# Patient Record
Sex: Male | Born: 1995 | Race: White | Hispanic: No | Marital: Single | Smoking: Never smoker
Health system: Western US, Academic
[De-identification: ages and names within clinical notes are randomized; demographics above are authoritative.]

## PROBLEM LIST (undated history)

## (undated) DIAGNOSIS — Z289 Immunization not carried out for unspecified reason: Secondary | ICD-10-CM

## (undated) HISTORY — DX: Immunization not carried out for unspecified reason: Z28.9

## (undated) HISTORY — PX: JOINT REPLACEMENT: SHX530

---
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
## 2013-10-15 ENCOUNTER — Emergency Department: Payer: Self-pay | Admitting: Emergency Medicine

## 2017-07-27 ENCOUNTER — Emergency Department: Payer: Self-pay

## 2017-07-27 ENCOUNTER — Emergency Department
Admission: EM | Admit: 2017-07-27 | Discharge: 2017-07-27 | Disposition: A | Discharge status: Home / Self Care | Payer: No Typology Code available for payment source | Attending: Emergency Medicine | Admitting: Emergency Medicine

## 2017-07-27 ENCOUNTER — Encounter: Payer: Self-pay | Admitting: Medical Oncology

## 2017-07-27 ENCOUNTER — Emergency Department
Admission: EM | Admit: 2017-07-27 | Discharge: 2017-07-27 | Discharge status: Against Medical Advice | Payer: Self-pay | Attending: Emergency Medicine | Admitting: Emergency Medicine

## 2017-07-27 ENCOUNTER — Emergency Department: Payer: No Typology Code available for payment source

## 2017-07-27 DIAGNOSIS — Y92410 Unspecified street and highway as the place of occurrence of the external cause: Secondary | ICD-10-CM | POA: Diagnosis not present

## 2017-07-27 DIAGNOSIS — Y999 Unspecified external cause status: Secondary | ICD-10-CM | POA: Diagnosis not present

## 2017-07-27 DIAGNOSIS — S99911A Unspecified injury of right ankle, initial encounter: Secondary | ICD-10-CM | POA: Diagnosis present

## 2017-07-27 DIAGNOSIS — Y929 Unspecified place or not applicable: Secondary | ICD-10-CM | POA: Insufficient documentation

## 2017-07-27 DIAGNOSIS — F17210 Nicotine dependence, cigarettes, uncomplicated: Secondary | ICD-10-CM | POA: Diagnosis not present

## 2017-07-27 DIAGNOSIS — Y939 Activity, unspecified: Secondary | ICD-10-CM | POA: Insufficient documentation

## 2017-07-27 DIAGNOSIS — X500XXA Overexertion from strenuous movement or load, initial encounter: Secondary | ICD-10-CM | POA: Insufficient documentation

## 2017-07-27 DIAGNOSIS — S92001A Unspecified fracture of right calcaneus, initial encounter for closed fracture: Secondary | ICD-10-CM | POA: Insufficient documentation

## 2017-07-27 DIAGNOSIS — Y9389 Activity, other specified: Secondary | ICD-10-CM | POA: Insufficient documentation

## 2017-07-27 DIAGNOSIS — W1789XA Other fall from one level to another, initial encounter: Secondary | ICD-10-CM | POA: Insufficient documentation

## 2017-07-27 MED ORDER — MELOXICAM 7.5 MG PO TABS: 7.5000 mg | ORAL_TABLET | Freq: Every day | ORAL | 1 refills | Status: AC

## 2017-07-27 NOTE — ED Triage Notes (Signed)
Pt reports jumping from about 5 ft and landing on his rt heel the wrong way, pt c/o pain to area.
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
## 2017-07-27 NOTE — ED Notes (Signed)
This RN notified by Elita Quick, EDT that patient was visualized leaving the ER with his friend. Pt was not D/C at this time.

## 2017-07-27 NOTE — ED Notes (Signed)
Patient's acuity changed to a 4 per MD Rifenbark.
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
## 2017-07-27 NOTE — ED Notes (Signed)
AAOx3.  Skin warm and dry.  NAD 

## 2017-07-27 NOTE — ED Notes (Signed)
Spoke with Dr Lamont Snowball regarding pt's return to ED to complete treatment; order for foot xray only;

## 2017-07-27 NOTE — ED Notes (Signed)
Pt seen here several hours ago for foot injury; left before treatment complete, says he had to leave to get his girlfriend at work; pt has returned to finish treatment, which he believes is just a splint to right foot;
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
## 2018-02-10 IMAGING — CT CT FOOT*R* W/O CM
3 series · 15 of 27 positions shown, 18 images · non-contrast
Comparison: Plain films earlier today.

CLINICAL DATA: Calcaneal fracture after jumping from roof.

EXAM:
CT OF THE RIGHT FOOT WITHOUT CONTRAST
TECHNIQUE: Multidetector CT imaging of the right foot was performed according
to the standard protocol. Multiplanar CT image reconstructions were
also generated.

[Series 602: long axia st · axial · 0.57mm/px · z∈[-369,-288]mm · 5 of 91 slices shown]
[im 16/91  bone]
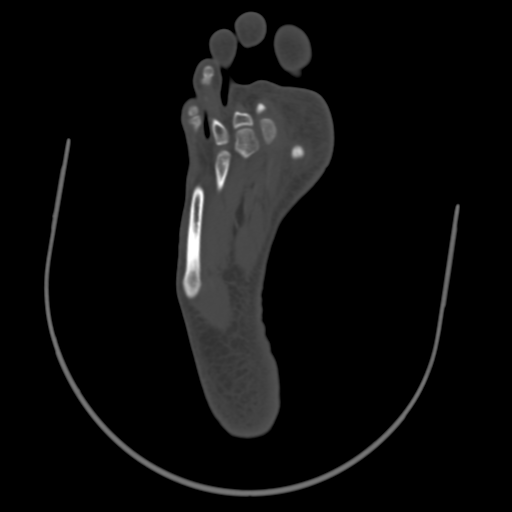
[im 31/91  bone]
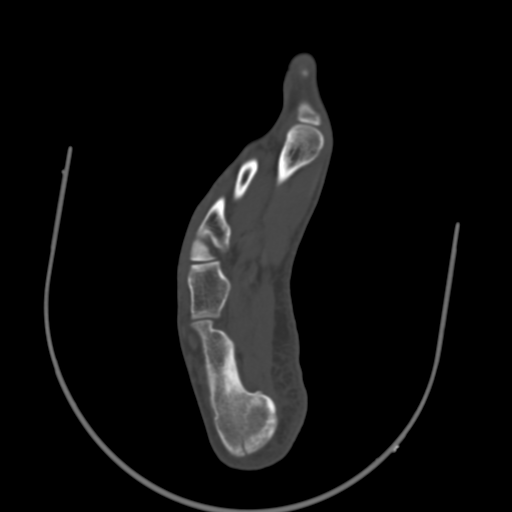
[im 46/91  bone]
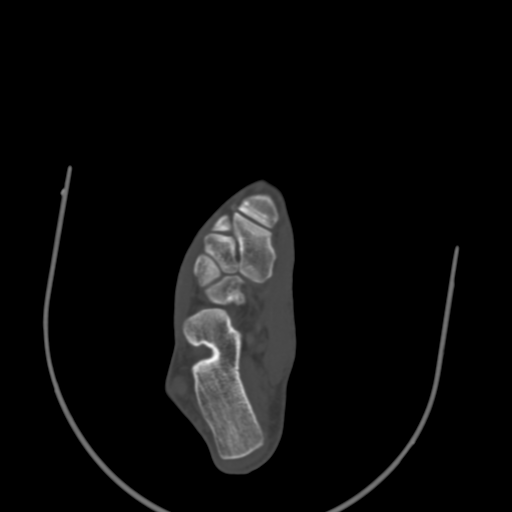
[im 61/91  bone]
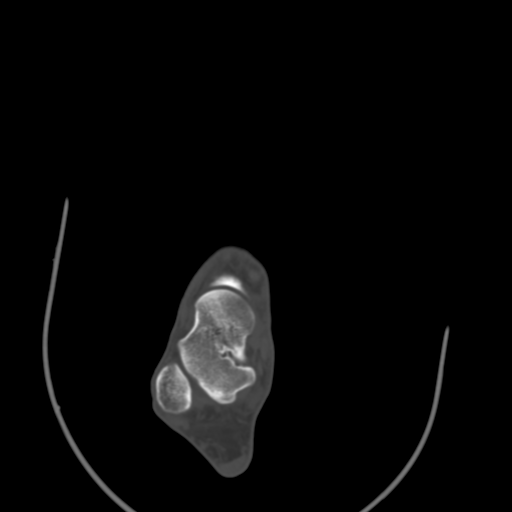
[im 76/91  bone]
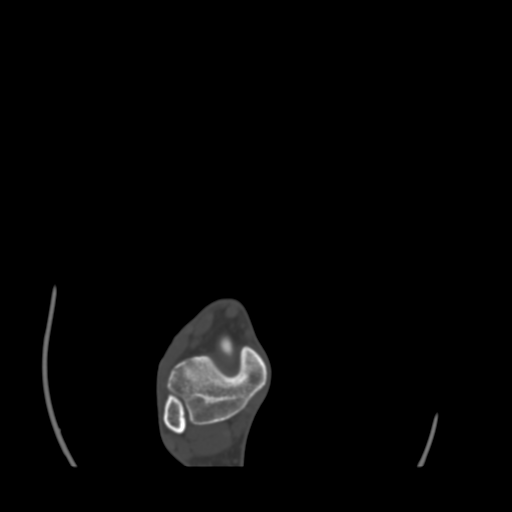

[Series 604: sag st · sagittal · 0.57mm/px · 5 of 61 slices shown, 6 images]
[im 21/61  bone]
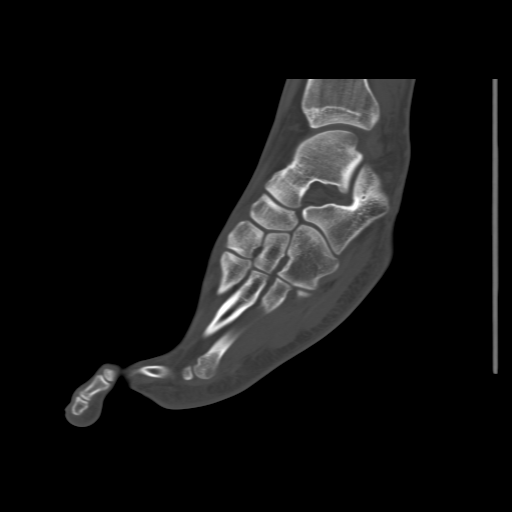
[im 26/61  bone]
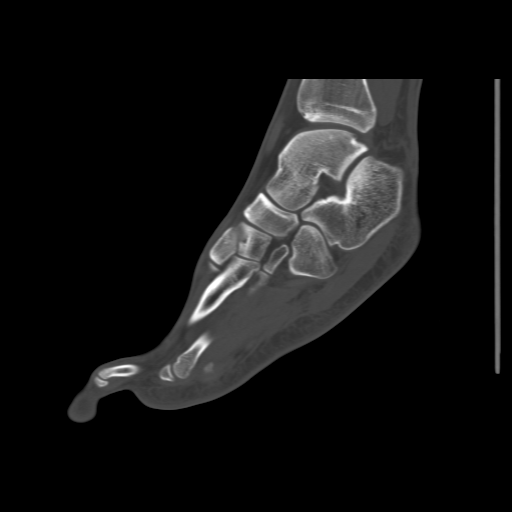
[im 31/61  soft-tissue]
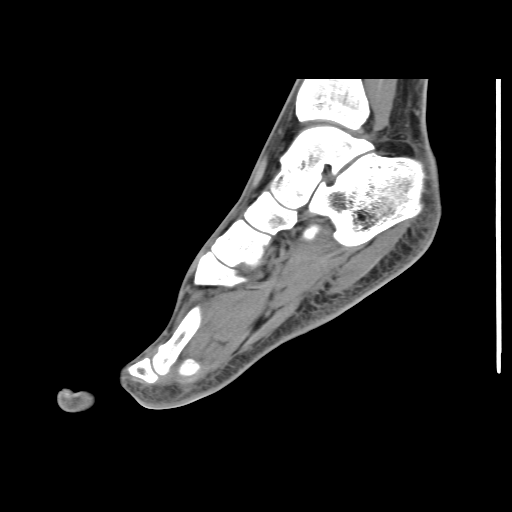
[im 31/61  bone]
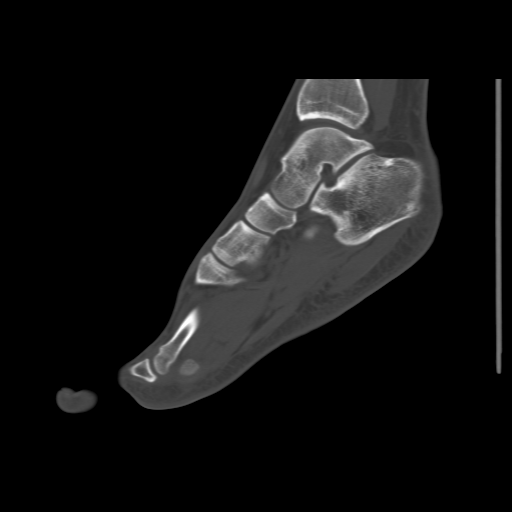
[im 36/61  bone]
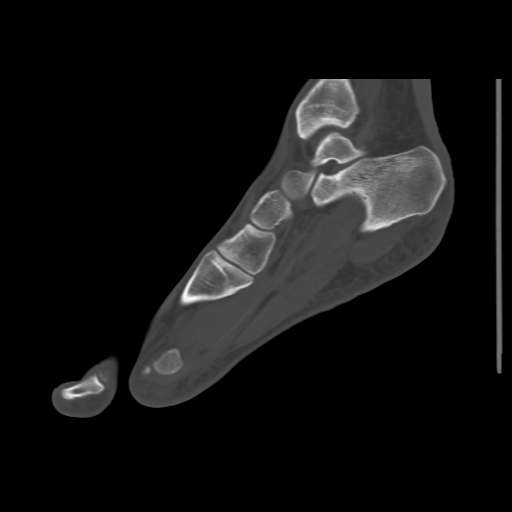
[im 41/61  bone]
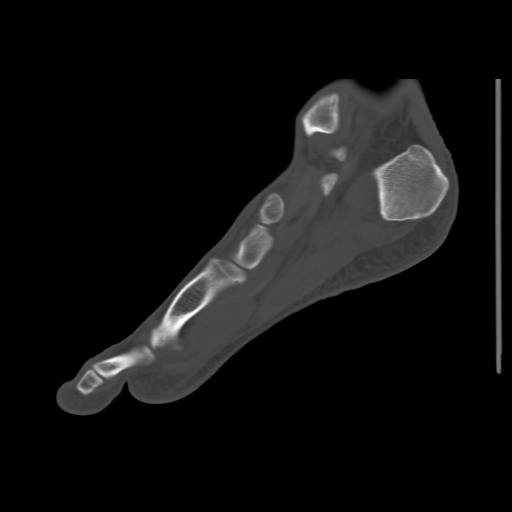

[Series 605: long axis bone · axial · 0.57mm/px · z∈[-381,-311]mm · 5 of 85 slices shown, 7 images]
[im 15/85  soft-tissue]
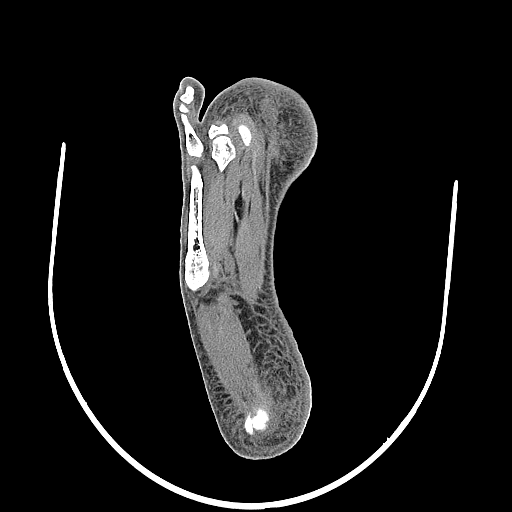
[im 15/85  bone]
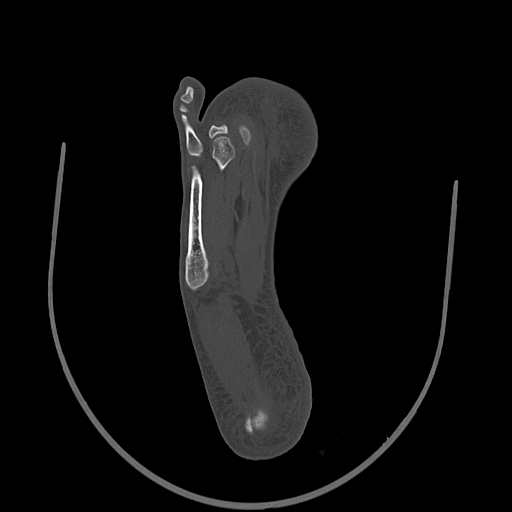
[im 29/85  bone]
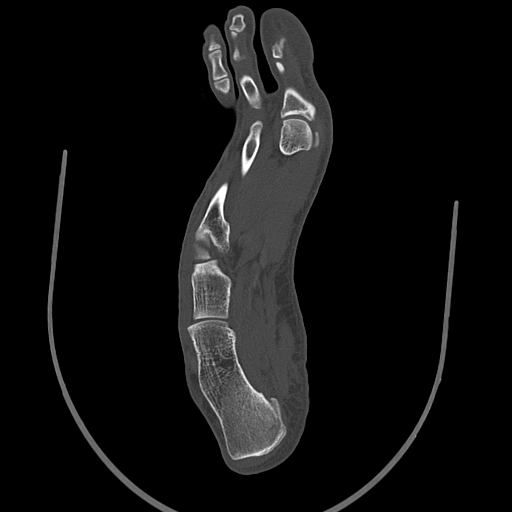
[im 43/85  bone]
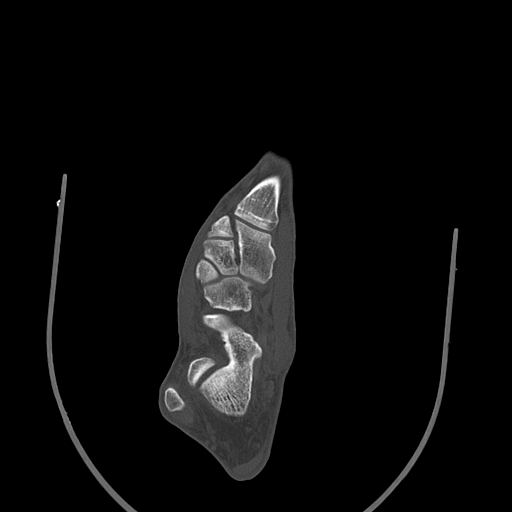
[im 57/85  bone]
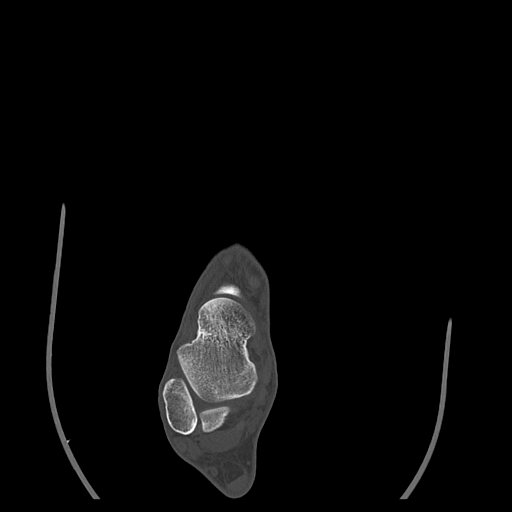
[im 71/85  soft-tissue]
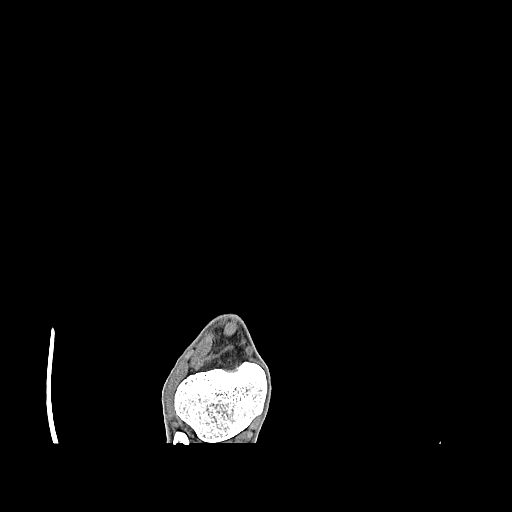
[im 71/85  bone]
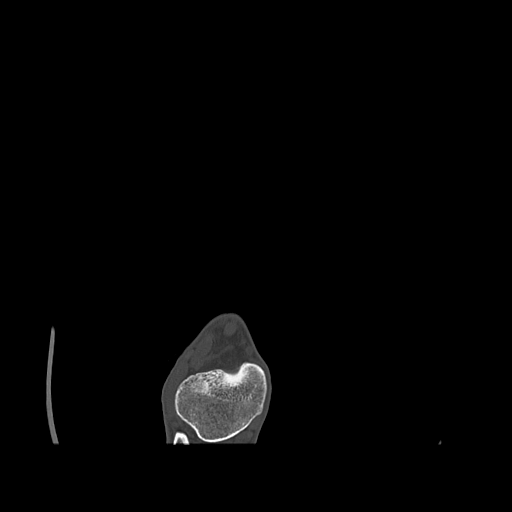

[15 of 27 positions shown; findings below may reference images not displayed]

FINDINGS: Bones/Joint/Cartilage

There is are fracture of the posterior calcaneus involving the
plantar medial aspect with long axis oblique to the long axis of the
calcaneus. There is very minimal displacement of this fracture and
no loss of height of the calcaneus. No other fractures identified.
Ankle mortise is normal.

Ligaments

Suboptimally assessed by CT.

Muscles and Tendons

Within normal.

Soft tissues

Mild subcutaneous edema over the heel.
IMPRESSION: Known oblique fracture of the calcaneus involving the posteromedial/
plantar aspect without significant displacement or loss of height of
the calcaneus.

## 2021-04-05 DIAGNOSIS — U071 COVID-19: Secondary | ICD-10-CM

## 2021-04-05 HISTORY — DX: COVID-19: U07.1

## 2021-08-21 ENCOUNTER — Encounter: Payer: Self-pay | Admitting: Family Medicine

## 2021-08-21 DIAGNOSIS — S62501A Fracture of unspecified phalanx of right thumb, initial encounter for closed fracture: Secondary | ICD-10-CM

## 2021-08-22 ENCOUNTER — Telehealth: Payer: Self-pay

## 2021-08-22 NOTE — Telephone Encounter (Signed)
[facility] department to use this Smart Phrase when sending a referral review request for an appointment    Reason for referral:  Closed nondisplaced fracture of phalanx of right thumb        Have records been uploaded to media? yes

## 2021-08-24 NOTE — Telephone Encounter (Signed)
Please schedule patient as indicated below.    Please combine departments to find sooner availability. Acute injuries schedule w/n 5 days. Send message to clinic supervisor

## 2021-09-01 ENCOUNTER — Encounter (HOSPITAL_BASED_OUTPATIENT_CLINIC_OR_DEPARTMENT_OTHER): Payer: Self-pay | Admitting: Hand Surgery

## 2021-09-04 ENCOUNTER — Encounter (HOSPITAL_BASED_OUTPATIENT_CLINIC_OR_DEPARTMENT_OTHER): Payer: Self-pay | Admitting: Hand Surgery

## 2021-09-04 ENCOUNTER — Ambulatory Visit: Payer: MEDICAID | Attending: Hand Surgery | Admitting: Hand Surgery

## 2021-09-04 ENCOUNTER — Ambulatory Visit (HOSPITAL_BASED_OUTPATIENT_CLINIC_OR_DEPARTMENT_OTHER): Admit: 2021-09-04 | Discharge: 2021-09-04 | Disposition: A | Discharge status: Home Health | Payer: MEDICAID

## 2021-09-04 DIAGNOSIS — S62021K Displaced fracture of middle third of navicular [scaphoid] bone of right wrist, subsequent encounter for fracture with nonunion: Secondary | ICD-10-CM | POA: Insufficient documentation

## 2021-09-04 DIAGNOSIS — S62021A Displaced fracture of middle third of navicular [scaphoid] bone of right wrist, initial encounter for closed fracture: Secondary | ICD-10-CM

## 2021-09-04 DIAGNOSIS — M79644 Pain in right finger(s): Secondary | ICD-10-CM

## 2021-09-04 DIAGNOSIS — M25531 Pain in right wrist: Secondary | ICD-10-CM | POA: Insufficient documentation

## 2021-09-04 DIAGNOSIS — S62501A Fracture of unspecified phalanx of right thumb, initial encounter for closed fracture: Secondary | ICD-10-CM | POA: Insufficient documentation
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
## 2021-09-05 ENCOUNTER — Telehealth (HOSPITAL_BASED_OUTPATIENT_CLINIC_OR_DEPARTMENT_OTHER): Payer: Self-pay | Admitting: Hand Surgery

## 2021-09-05 NOTE — Telephone Encounter (Signed)
4:00 pm 09/04/21 - Patient called & left message to schedule surgery with  Dr. Sabino Snipes. Phone [phone].

## 2021-09-05 NOTE — Telephone Encounter (Signed)
I spoke to patient & scheduled surgery. Patient voiced understanding. See below.    Consents: 09/04/2021 In media - DR. MEUNIER TO SIGN SURGERY CONSENT ON DAY OF SURGERY.    [facility] Phone Appointment: 09/12/2021 at 10:00 am [facility])    H&P: Day of surgery  Sx Date/Time: 09/21/2021 at 11:15 am, check in at 9:15 am ([facility])  P/O:  10/04/2021 at 10:20 am with Dr. Sabino Snipes, arrive 20 min early ([employer])     Surgery Confirmation Letter sent via [place] My Chart on 09/05/2021  Tentative Surgery appointment information relayed to patient. Surgery location confirmed with patient and the following instructions given:      All surgical patients scheduled for [facility] will be called the day before their scheduled surgery to remind them of the elements:     i. Date and Time to be NPO as specified by the patients Surgeon or Anesthesia Provider.   ii. Ensure patient will have transportation home and be accompanied by a responsible adult.   iii. Reminder to leave valuables at home (give examples if needed).   iv. Instructions to bring funds for co-pay of medications if required.  v. Assess for any language issues, document need for translator (if needed) in EPIC, and communicate on the OR schedule.   vi. Remind patient to not wax or shave near the intended surgical site.   vii. Provide the patient with a number to call if they are going to be late or are unable to report for their surgery as scheduled. Patient to arrive 2.0 hours prior to surgery start time. Please check in at admissions.  viii. If patient was not seen in [facility], remind him/her to shower the morning of surgery, remove any piercings and/or jewelry, do not wear makeup, and do wear loose comfortable clothes, bring current medication list.   ix. Call surgeon for any specific questions regarding the procedures related to surgical preparation, or medications.

## 2021-09-12 ENCOUNTER — Ambulatory Visit (INDEPENDENT_AMBULATORY_CARE_PROVIDER_SITE_OTHER): Payer: MEDICAID

## 2021-09-12 ENCOUNTER — Encounter (INDEPENDENT_AMBULATORY_CARE_PROVIDER_SITE_OTHER): Payer: Self-pay

## 2021-09-12 VITALS — Ht 74.0 in | Wt 165.0 lb

## 2021-09-12 DIAGNOSIS — Z01818 Encounter for other preprocedural examination: Secondary | ICD-10-CM

## 2021-09-12 NOTE — Interdisciplinary (Signed)
Anesthesia Preparedness Clinic [facility]) RN PHONE CALL NOTE    Phone call to patient from [facility] RN today.     Confirmed medical history & that medications listed in Epic are accurate and up to date.     Patient scheduled for surgery on 09/21/2021 at [facility]    Confirmed preoperative instructions with patient including NPO instructions.     Planning your surgery information sent to patient via MyChart    Pt had COVID June 2022, not hospitalized, denies lingering symptoms/sequelae---still NOT vaccinated against COVID    No questions or concerns at this time.

## 2021-09-12 NOTE — Patient Instructions (Signed)
Your surgery is currently scheduled at [facility] on 09/21/2021  The scheduler will be contacting you with the check in time                                                                                 FACILITY ADDRESS    [facility], [address] [place], [place] [address]  Check in and Operating room is down the elevator on the lower level (LL)    PARKING    [facility]: [employer], Adult nurse structure, or Scientist, water quality parking (7am-5pm at [employer] entrance and [employer] engineer; 5am-5pm at [employer] by Emergency room/Labor and Delivery) for same cost as self-parking.  [email or web address]       QUESTIONS    If you have any questions between now and the day of your surgery, please do not hesitate to call:     [facility] Anesthesia Prepared[facility]: [phone]   [facility] [phone]      DAY OF SURGERY ARRIVAL TIME:    On the day of your Surgery/Procedure, please arrive at the time provided by the surgeon's clinic.  [facility] surgery Pre-op team will contact you the day before surgery.      MEDICATION INSTRUCTIONS BEFORE SURGERY/PROCEDURE:        PLEASE HOLD ALL NSAIDS (non-steroidal anti-inflammatory drugs) SUCH AS advil, aleve, motrin, ibuprofen, relafen, lodine, feldene, Diclofenac, voltaren, indomethacin, naproxen, celebrex, Mobic 7 days before surgery.       Please hold vitamins, supplements, CO-Q10, Glucosamine, herbs & fish oil 7 days before surgery. Vitamin C, D are ok to take.     It is OK to take acetaminophen (Tylenol) for pain around the time of surgery unless you have liver disease.      AFTER YOUR VISIT WITH [place], IF YOU START TAKING A NEW MEDICATION BEFORE SURGERY, PLEASE CALL us TO MAKE SURE IT IS SAFE TO TAKE & WILL NOT AFFECT YOUR SURGERY.         OSA INSTRUCTIONS:     If you use a CPAP machine, please bring the entire machine, including mask and tubing, with you on the day of  surgery.    TO DO LIST:          EATING/DRINKING     DO NOT EAT OR DRINK ANYTHING AFTER MIDNIGHT ON THE DAY OF SURGERY    Preparing for your Surgery:     Please wear clean loose-fitting clothes and leave valuables at home   Do not shave or remove body hair. Facial shaving is permitted. If you are having head/face surgery, ask your surgeon's office whether you can shave.  Bring a picture ID and your insurance card, and be prepared to pay your deductible or co-insurance by cash, check, or credit card when you arrive.   All patients [facility] go home after their surgery, Please make sure to arrange for an adult to drive you home. You CANNOT use UBER or LYFT. If you do not have a ride, your surgery may be cancelled.       On The Day of Your Surgery:      Check in at the location mentioned above  COVID testing may be done on arrival to the Pre-op or Procedural areas according to the current [facility] mandate.   If you are a woman of child bearing age, please note that you may be asked to give a urine sample upon check-in  You will meet your anesthesia and surgery teams in the preoperative holding area before surgery.   Once surgery is over, you will wake up in the recovery room.  An adult chaperone will need to stay with you for the first 24 hours after surgery.   Visitor policy during the JIRCV-89 pandemic is subject to change. Current visitor policy can be found at [email or web address]      A video about what to expect for the day of surgery can be found here:    [email or web address]  Or by searching You-tube for [place] before surgery and [place] after surgery     Your medical records are available to you at [email or web address] click sign up now.

## 2021-09-20 ENCOUNTER — Other Ambulatory Visit: Payer: Self-pay

## 2021-09-20 ENCOUNTER — Encounter (HOSPITAL_BASED_OUTPATIENT_CLINIC_OR_DEPARTMENT_OTHER): Payer: Self-pay | Admitting: Hand Surgery

## 2021-09-20 ENCOUNTER — Other Ambulatory Visit (HOSPITAL_BASED_OUTPATIENT_CLINIC_OR_DEPARTMENT_OTHER): Payer: Self-pay | Admitting: Hand Surgery

## 2021-09-20 DIAGNOSIS — Z419 Encounter for procedure for purposes other than remedying health state, unspecified: Secondary | ICD-10-CM

## 2021-09-20 NOTE — Anesthesia Preprocedure Evaluation (Addendum)
ANESTHESIA PRE-OPERATIVE EVALUATION    Patient Information    Name: Randy Erickson    MRN: [id number]    DOB: 1996-07-05    Age: 25 year old    Sex: male  Procedure(s):  Right repair fracture, nonunion, scaphoid, possible bone graft      Pre-op Vitals:   There were no vitals taken for this visit.        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     General:  negative for General ROS   Cardiovascular:     Anesthesia History:  negative anesthesia history ROS   Pulmonary:      Neuro/Psych:   negative neuro/psych ROS   Hematology/Oncology:   hematologic/lymphatic negative      GI/Hepatic:   Infectious Disease:  negative for infectious disease     Renal:   Endocrine/Other:     Pregnancy History:   Pediatrics:         Pre Anesthesia Testing (PCC/CPC) notes/comments:                 Physical Exam    Airway:    Inter-inciser distance > 4 cm  Prognanth Able    Mallampati: I  Neck ROM: full  TM distance: 5-6 cm  Short thick neck: No          Cardiovascular:  - cardiovascular exam normal   Rhythm: regular   Rate: normal         Pulmonary:  - pulmonary exam normal      breath sounds clear to auscultation        Neuro/Neck/Skeletal/Skin:  - Neck/Neuro/Skeletal/Skin exam normal          Dental:  - normal exam    Abdominal:   - normal exam     General: normal weight     Additional Clinical Notes:               Last  OSA (STOP BANG) Score:  No data recorded    Last OSA  (STOP) Score for   Has a physician diagnosed you with sleep apnea?: No  Do you use a CPAP at home?: No  Do you snore loudly (loud enough to be heard through a closed door)?: 0  Do you often feel tired, fatigued or sleepy during the day?: 0  Has anyone observed that you stop breathing while you are sleeping?: 0  Have you ever been treated for high blood pressure?: 0  OSA total score (A score of 2 or more is high risk. Offer patient sleep  study.): 0                   Past Medical History:   Diagnosis Date    COVID-19 vaccination not done     COVID-19 virus infection 04/2021    not hospitalized; denies lingering symptoms/sequelae     Past Surgical History:   Procedure Laterality Date    hand Right 2010     Social History     Socioeconomic History    Marital status: Single   Tobacco Use    Smoking status: Never    Smokeless tobacco: Never   Vaping Use    Vaping Use: Every day    Substances: Nicotine   Substance and Sexual Activity    Alcohol use: Not Currently     Alcohol  Use: Not on file       No current facility-administered medications for this encounter.     No current outpatient medications on file.     No Known Allergies    Labs and Other Data  No results found for: NA, SODIUM, K, CL, BICARB, BUN, CREAT, GLU, [place]  No results found for: AST, ALT, GGT, LDH, ALK, TP, ALB, TBILI, DBILI  No results found for: WBC, RBC, HGB, HCT, MCV, MCHC, RDW, PLT, PLCTEL, MPV, MPVH, SEG, LYMPHS, MONOS, EOS, BASOS  No results found for: INR, PTT  No results found for: ARTPH, ARTPO2, ARTPCO2    Anesthesia Plan:  Risks and Benefits of Anesthesia  I have personally performed an appropriate pre-anesthesia physical exam of the patient (including heart, lungs, and airway) prior to the anesthetic and reviewed the pertinent medical history, drug and allergy history, laboratory and imaging studies and consultations.   I have determined that the patient has had adequate assessment and testing.  I have validated the documentation of these elements of the patient exam and/or have made necessary changes to reflect my own observations during my pre-anesthesia exam.  Anesthetic techniques, invasive monitors, anesthetic drugs for induction, maintenance and post-operative analgesia, risks and alternatives have been explained to the patient and/or patient's representatives.    I have prescribed the anesthetic plan:         Planned anesthesia method: General         ASA 1  (Healthy)     Potential anesthesia problems identified and risks including but not limited to the following were discussed with patient and/or patient's representative: Adverse or allergic drug reaction, Administration of blood products, Recall, Ocular injury, Dental injury or sore throat, Nerve injury, Death, Injury to brain, heart and other organs and Patient declined further  discussion of risks of anesthesia    No Beta Blocker Indicated: Patient not on beta blockers    Planned monitoring method: Routine monitoring    Informed Consent:  Anesthetic plan and risks discussed with Patient.    Plan discussed with OR Nurse and Surgeon.

## 2021-09-21 ENCOUNTER — Encounter (HOSPITAL_BASED_OUTPATIENT_CLINIC_OR_DEPARTMENT_OTHER): Payer: Self-pay | Admitting: Hand Surgery

## 2021-09-21 ENCOUNTER — Ambulatory Visit (HOSPITAL_BASED_OUTPATIENT_CLINIC_OR_DEPARTMENT_OTHER): Payer: MEDICAID | Admitting: Student in an Organized Health Care Education/Training Program

## 2021-09-21 ENCOUNTER — Ambulatory Visit
Admission: RE | Admit: 2021-09-21 | Discharge: 2021-09-21 | Disposition: A | Discharge status: Home / Self Care | Payer: MEDICAID | Attending: Hand Surgery | Admitting: Hand Surgery

## 2021-09-21 ENCOUNTER — Ambulatory Visit (HOSPITAL_BASED_OUTPATIENT_CLINIC_OR_DEPARTMENT_OTHER): Payer: MEDICAID

## 2021-09-21 ENCOUNTER — Encounter (HOSPITAL_BASED_OUTPATIENT_CLINIC_OR_DEPARTMENT_OTHER): Payer: Self-pay | Admitting: Student in an Organized Health Care Education/Training Program

## 2021-09-21 ENCOUNTER — Encounter (HOSPITAL_BASED_OUTPATIENT_CLINIC_OR_DEPARTMENT_OTHER): Admission: RE | Disposition: A | Discharge status: Home Health | Payer: Self-pay | Attending: Hand Surgery

## 2021-09-21 ENCOUNTER — Other Ambulatory Visit (HOSPITAL_COMMUNITY): Payer: Self-pay

## 2021-09-21 DIAGNOSIS — W19XXXS Unspecified fall, sequela: Secondary | ICD-10-CM

## 2021-09-21 DIAGNOSIS — S62021K Displaced fracture of middle third of navicular [scaphoid] bone of right wrist, subsequent encounter for fracture with nonunion: Secondary | ICD-10-CM | POA: Insufficient documentation

## 2021-09-21 DIAGNOSIS — S62002K Unspecified fracture of navicular [scaphoid] bone of left wrist, subsequent encounter for fracture with nonunion: Secondary | ICD-10-CM | POA: Insufficient documentation

## 2021-09-21 DIAGNOSIS — Z419 Encounter for procedure for purposes other than remedying health state, unspecified: Secondary | ICD-10-CM

## 2021-09-21 DIAGNOSIS — S62001K Unspecified fracture of navicular [scaphoid] bone of right wrist, subsequent encounter for fracture with nonunion: Secondary | ICD-10-CM

## 2021-09-21 SURGERY — REPAIR, FRACTURE NONUNION, SCAPHOID BONE
Anesthesia: General | Site: Wrist | Laterality: Right | Wound class: Class I (Clean)

## 2021-09-21 MED ORDER — MIDAZOLAM HCL 2 MG/2ML IJ SOLN
INTRAMUSCULAR | Status: AC
Start: 2021-09-21 — End: ?
  Filled 2021-09-21: qty 2

## 2021-09-21 MED ORDER — ONDANSETRON HCL 4 MG/2ML IV SOLN
INTRAMUSCULAR | Status: DC | PRN
Start: 2021-09-21 — End: 2021-09-21
  Administered 2021-09-21 (×2): 4 mg via INTRAVENOUS

## 2021-09-21 MED ORDER — BACITRACIN ZINC 500 UNIT/GM EX OINT
TOPICAL_OINTMENT | CUTANEOUS | Status: AC
Start: 2021-09-21 — End: ?
  Filled 2021-09-21: qty 15

## 2021-09-21 MED ORDER — LIDOCAINE HCL (PF) 1 % IJ SOLN
0.1000 mL | Freq: Once | INTRAMUSCULAR | Status: DC | PRN
Start: 2021-09-21 — End: 2021-09-21

## 2021-09-21 MED ORDER — DIPHENHYDRAMINE HCL 50 MG/ML IJ SOLN
12.5000 mg | Freq: Once | INTRAMUSCULAR | Status: DC | PRN
Start: 2021-09-21 — End: 2021-09-21

## 2021-09-21 MED ORDER — ACETAMINOPHEN 500 MG OR TABS
ORAL_TABLET | ORAL | 0 refills | Status: AC
Start: 2021-09-21 — End: ?

## 2021-09-21 MED ORDER — HYDROMORPHONE HCL 1 MG/ML IJ SOLN
0.5000 mg | INTRAMUSCULAR | Status: DC | PRN
Start: 2021-09-21 — End: 2021-09-21

## 2021-09-21 MED ORDER — BACITRACIN 500 UNIT/GM EX OINT (CUSTOM)
TOPICAL_OINTMENT | CUTANEOUS | Status: DC | PRN
Start: 2021-09-21 — End: 2021-09-21
  Administered 2021-09-21 (×2): 3 via TOPICAL

## 2021-09-21 MED ORDER — IBUPROFEN 600 MG OR TABS
600.0000 mg | ORAL_TABLET | Freq: Four times a day (QID) | ORAL | 1 refills | Status: AC | PRN
Start: 2021-09-21 — End: ?

## 2021-09-21 MED ORDER — MEPERIDINE HCL 25 MG/ML IJ SOLN
25.0000 mg | INTRAMUSCULAR | Status: DC | PRN
Start: 2021-09-21 — End: 2021-09-21

## 2021-09-21 MED ORDER — BUPIVACAINE HCL (PF) 0.25 % IJ SOLN
INTRAMUSCULAR | Status: DC | PRN
Start: 2021-09-21 — End: 2021-09-21
  Administered 2021-09-21 (×2): 20 mL via INTRAMUSCULAR

## 2021-09-21 MED ORDER — CEFAZOLIN SODIUM 1 GM IJ SOLR
INTRAMUSCULAR | Status: DC | PRN
Start: 2021-09-21 — End: 2021-09-21
  Administered 2021-09-21 (×2): 2000 mg via INTRAVENOUS

## 2021-09-21 MED ORDER — FENTANYL CITRATE (PF) 100 MCG/2ML IJ SOLN
INTRAMUSCULAR | Status: AC
Start: 2021-09-21 — End: ?
  Filled 2021-09-21: qty 2

## 2021-09-21 MED ORDER — DEXAMETHASONE SODIUM PHOSPHATE 4 MG/ML IJ SOLN (CUSTOM)
INTRAMUSCULAR | Status: DC | PRN
Start: 2021-09-21 — End: 2021-09-21
  Administered 2021-09-21 (×2): 6 mg via INTRAVENOUS

## 2021-09-21 MED ORDER — AMISULPRIDE (ANTIEMETIC) 10 MG/4ML IV SOLN
10.0000 mg | Freq: Once | INTRAVENOUS | Status: AC | PRN
Start: 2021-09-21 — End: 2021-09-21
  Administered 2021-09-21 (×2): 10 mg via INTRAVENOUS
  Filled 2021-09-21: qty 4

## 2021-09-21 MED ORDER — OXYCODONE HCL 5 MG OR TABS
5.0000 mg | ORAL_TABLET | Freq: Four times a day (QID) | ORAL | 0 refills | Status: AC | PRN
Start: 2021-09-21 — End: ?

## 2021-09-21 MED ORDER — NALOXONE HCL 0.4 MG/ML IJ SOLN
0.1000 mg | INTRAMUSCULAR | Status: DC | PRN
Start: 2021-09-21 — End: 2021-09-21

## 2021-09-21 MED ORDER — LACTATED RINGERS IV SOLN
INTRAVENOUS | Status: DC
Start: 2021-09-21 — End: 2021-09-21

## 2021-09-21 MED ORDER — MENTHOL 3 MG MT LOZG
1.0000 | LOZENGE | OROMUCOSAL | Status: DC | PRN
Start: 2021-09-21 — End: 2021-09-21

## 2021-09-21 MED ORDER — ACETAMINOPHEN 325 MG PO TABS
975.0000 mg | ORAL_TABLET | Freq: Once | ORAL | Status: AC
Start: 2021-09-21 — End: 2021-09-21
  Administered 2021-09-21 (×2): 975 mg via ORAL
  Filled 2021-09-21: qty 3

## 2021-09-21 MED ORDER — MIDAZOLAM HCL 2 MG/2ML IJ SOLN
INTRAMUSCULAR | Status: DC | PRN
Start: 2021-09-21 — End: 2021-09-21
  Administered 2021-09-21 (×2): 2 mg via INTRAVENOUS

## 2021-09-21 MED ORDER — FENTANYL CITRATE (PF) 50 MCG/ML IJ SOLN (WRAPPED RECORD) ~~LOC~~
50.0000 ug | INTRAMUSCULAR | Status: DC | PRN
Start: 2021-09-21 — End: 2021-09-21
  Administered 2021-09-21 (×3): 50 ug via INTRAVENOUS
  Filled 2021-09-21 (×2): qty 1

## 2021-09-21 MED ORDER — PROPOFOL 200 MG/20ML IV EMUL
INTRAVENOUS | Status: DC | PRN
Start: 2021-09-21 — End: 2021-09-21
  Administered 2021-09-21: 10:00:00 50 mg via INTRAVENOUS
  Administered 2021-09-21 (×2): 40 mg via INTRAVENOUS
  Administered 2021-09-21: 10:00:00 50 mg via INTRAVENOUS
  Administered 2021-09-21: 09:00:00 40 mg via INTRAVENOUS
  Administered 2021-09-21: 09:00:00 200 mg via INTRAVENOUS
  Administered 2021-09-21: 09:00:00 40 mg via INTRAVENOUS

## 2021-09-21 MED ORDER — FENTANYL CITRATE (PF) 250 MCG/5ML IJ SOLN
INTRAMUSCULAR | Status: DC | PRN
Start: 2021-09-21 — End: 2021-09-21
  Administered 2021-09-21 (×2): 100 ug via INTRAVENOUS

## 2021-09-21 MED ORDER — GLYCOPYRROLATE 1 MG/5ML IJ SOLN
INTRAMUSCULAR | Status: DC | PRN
Start: 2021-09-21 — End: 2021-09-21
  Administered 2021-09-21: 10:00:00 .1 mg via INTRAVENOUS
  Administered 2021-09-21: 09:00:00 .4 mg via INTRAVENOUS
  Administered 2021-09-21: 10:00:00 .1 mg via INTRAVENOUS

## 2021-09-21 MED ORDER — OXYCODONE HCL 5 MG OR TABS
5.0000 mg | ORAL_TABLET | ORAL | Status: DC | PRN
Start: 2021-09-21 — End: 2021-09-21
  Administered 2021-09-21 (×2): 5 mg via ORAL
  Filled 2021-09-21: qty 1

## 2021-09-21 MED ORDER — LACTATED RINGERS IV SOLN
INTRAVENOUS | Status: DC | PRN
Start: 2021-09-21 — End: 2021-09-21

## 2021-09-21 MED ORDER — ONDANSETRON HCL 4 MG/2ML IV SOLN
4.0000 mg | Freq: Once | INTRAMUSCULAR | Status: DC | PRN
Start: 2021-09-21 — End: 2021-09-21

## 2021-09-21 MED ORDER — BUPIVACAINE HCL (PF) 0.25 % IJ SOLN
INTRAMUSCULAR | Status: AC
Start: 2021-09-21 — End: ?
  Filled 2021-09-21: qty 30

## 2021-09-21 MED ORDER — LIDOCAINE HCL 2 % IJ SOLN WRAPPED RECORD
INTRAMUSCULAR | Status: DC | PRN
Start: 2021-09-21 — End: 2021-09-21
  Administered 2021-09-21 (×3): 40 mg via INTRAVENOUS

## 2021-09-21 SURGICAL SUPPLY — 30 items
APPLICATOR CHLORAPREP 26ML, ~~LOC~~ (Prep Solutions) ×2 IMPLANT
BLADE SAGITTAL 31 X 9MM (Knives/Blades) ×2 IMPLANT
CAUTERY BOVIE ROCKER SWITCH PENCIL (Cautery) ×2 IMPLANT
DRAPE HOLOGIC FLUOROSCAN (Drapes/towels) ×2 IMPLANT
DRESSING ADAPTIC 3X3 STERILE (Dressings/packing) ×2 IMPLANT
DRILL BIT CANNULATED QC 2.0 X 145MM (Drills/Bits/Burs/Taps/Reamers) ×2 IMPLANT
GLOVE BIOGEL INDICATOR UNDERGLOVE SIZE 8 (Gloves/Gowns) ×4 IMPLANT
GLOVE BIOGEL LF SIZE 8 INDICATOR (Gloves/Gowns) ×2
GLOVE BIOGEL PI INDICATOR SIZE 7 (Gloves/Gowns) ×2 IMPLANT
GLOVE BIOGEL PI LF SIZE 8 INDICATOR (Gloves/Gowns) ×1 IMPLANT
GLOVE BIOGEL PI ULTRATOUCH SIZE 6 (Gloves/Gowns) ×2 IMPLANT
GLOVE BIOGEL PI ULTRATOUCH SIZE 8 (Gloves/Gowns) ×2 IMPLANT
GLOVE SURGICAL BIOGEL SIZE 6.5 (Gloves/Gowns) ×2 IMPLANT
GLOVE SURGICAL BIOGEL SIZE 8 (Gloves/Gowns) ×2 IMPLANT
GOWN SURGICAL ULTRA XL BLUE, AAMI LVL 3 (Gloves/Gowns) ×2 IMPLANT
PAD GROUND VALLEYLAB REM ADULT E7507 (Cautery) ×2 IMPLANT
PAD UNDERCAST WEBRIL 4X4YD (Dressings/packing) ×2 IMPLANT
PROTECTOR ULNAR NERVE PAD, YELLOW (Patient Care Supply) ×2 IMPLANT
SCREW HEADLESS TI COMPRESSION 2.5 X 24MM (Screws/anchors/cables) ×2 IMPLANT
SLEEVE SCD KNEE MEDIUM (Patient Care Supply) ×2 IMPLANT
SPLINT PLASTER 4X15 (External orthotic collars/braces/supports) ×24 IMPLANT
STRIP MEDI-STRIP SKIN CLOSURE 1/2 X 4" (Dressings/packing) ×1 IMPLANT
STRIP SKIN CLOSURE 1/2 X 4 (Dressings/packing) ×2
SURGICAL PACK EXTREMITY HAND (Procedure Packs/kits) ×2 IMPLANT
SUTURE ETHIBOND EXCEL 3-0 36" RB-1 (Suture) ×2 IMPLANT
SUTURE ETHILON 3-0 18" PS-2 (Suture) IMPLANT
SUTURE MONOCRYL 4-0 18" PS-2 (Y496) (Suture) ×2 IMPLANT
SUTURE VICRYL PLUS 3-0 27" PS-2 VCP427H (Suture) ×2 IMPLANT
TOURNIQUET CUFF RED DISPOSABLE 18" X 4" (Misc Surgical Supply) ×2 IMPLANT
WIRE GUIDE 1.1 X 150MM TROCAR TIP (Procedural wires/sheaths/catheters/balloons/dilators) ×2 IMPLANT

## 2021-09-21 NOTE — RN OR/Procedure Note (Signed)
Pt. Refusing to give up his phone to be locked up with his belongings.  Pt stated he was told it was okay for him to take his phone to the OR.  Pt was warned phone may get lost between going to the OR and PACU however he still refused to have his phone locked up with his belongings.

## 2021-09-21 NOTE — Brief Op Note (Signed)
BRIEF OPERATIVE NOTE    DATE: 09/21/2021  TIME: 10:27 AM    PREOPERATIVE DIAGNOSIS: Right scaphoid nonunion    POSTOPERATIVE DIAGNOSIS: Same    PROCEDURE: Open treatment right scaphoid waist nonunion with distal radius cancellous bone graft    ATTENDING SURGEON: Meunier  ASSISTANTS(s): Cigi Bega, PAC    ANESTHESIA: gen LMA + 20 cc 0.5% marcaine plain    FINDINGS: restored alignment following bone graft and screw fixation    WOUND CLASSIFICATION:  Procedure(s):  Operative fixation of right scaphoid fracture with autograft from distal radius - Wound Class: Class I (Clean) - Incision Closure: Deep and Superficial Layers    WOUND CLOSURE STATUS:  Procedure(s):  Operative fixation of right scaphoid fracture with autograft from distal radius - Wound Class: Class I (Clean) - Incision Closure: Deep and Superficial Layers    SPECIMENS: none    Implant Name Type Inv. Item Serial No. Manufacturer Lot No. LRB No. Used Action   SCREW HEADLESS TI COMPRESSION 2.5 X - SN/A Screws/anchors/cables SCREW HEADLESS TI COMPRESSION 2.5 X N/A [employer] N/A Right 1 Implanted       Fluids/Blood Products:      IV Fluids: 600 cc crystalloid    Blood Products: none    EBL: none    Tourniquet time: 76 min    Urine Output: n/a    COMPLICATIONS: none    DISPOSITION: stable to PACU, discharge home when recovered from anesthesia    - splint to be left in place until first office visit  - elevate extremity  - follow up in clinic in 1-2 weeks

## 2021-09-21 NOTE — Anesthesia Postprocedure Evaluation (Signed)
Anesthesia Post Note    Patient: Randy Erickson    Procedure(s) Performed: Procedure(s):  Operative fixation of right scaphoid fracture with autograft from distal radius      Final anesthesia type: General    Patient location: PACU    Post anesthesia pain: adequate analgesia    Mental status: awake, alert  and oriented    Airway Patent: Yes    Last Vitals:   Vitals Value Taken Time   BP 111/69 09/21/21 1115   Temp 36.6 C 09/21/21 1115   Pulse 72 09/21/21 1115   Resp 15 09/21/21 1115   SpO2 98 % 09/21/21 1115        Post vital signs: stable    Hydration: adequate    N/V:no    Anesthetic complications: no    Plan of care per primary team.

## 2021-09-21 NOTE — Consults (Signed)
Regional Anesthesia Service Initial Consult Note  Service Pager: [phone]    Referring MD: Sabino Snipes, MD  Referring Department: General Ortho  Regional Anesthesia Attending: Vassie Loll, MD      History of Present Illness:  Randy Erickson is a 25 year old male with no PMH who was seen on 09/21/2021 for R scaphoid nonunion fracture repair.     Patient's anticipated pain will be in the right wrist and hand.  The patient currently reports a pain score of 2/10. It is located in the right wrist and the pain is described as throbbing in quality.    The primary service has requested that we evaluate the patient to determine if they are an appropriate candidate for a regional anesthestic intervention to assist them with acute pain management.    Anticoagulation reviewed: none    Home Medications:  No medications prior to admission.        No Known Allergies  Past Medical History:   Diagnosis Date    COVID-19 vaccination not done     COVID-19 virus infection 04/2021    not hospitalized; denies lingering symptoms/sequelae     Past Surgical History:   Procedure Laterality Date    hand Right 2010     Social History     Socioeconomic History    Marital status: Single   Tobacco Use    Smoking status: Never    Smokeless tobacco: Never   Vaping Use    Vaping Use: Every day    Substances: Nicotine   Substance and Sexual Activity    Alcohol use: Not Currently     No family history on file.      Review of Systems:  General: Negative  Psychiatry: Negative  HEENT: Negative  Cardiovascular: Negative  Respiratory: Negative  Gastrointestinal: Negative  Neuro: negative  Musculoskeletal: Negative    Remainder of a 12 point review of systems is negative      Imaging Studies:   No results found for this or any previous visit.      Labs:  No results found for: WBC, RBC, HGB, HCT, MCV, MCHC, RDW, PLT, MPV  No results found for: INR, PTT         Physical Exam:  Vitals: BP 123/89 (BP Location: Left arm, BP Patient Position: Sitting)    Pulse 69     Temp 36.5 C    Resp 16    Ht 6\' 2"  (1.88 m)    Wt 75.5 kg (166 lb 8 oz)    SpO2 97%    BMI 21.38 kg/m   General: alert, no distress, appears stated age  Mental Status:  alert. Speech is clear/ normal  Affect: euthymic  Respiratory: Breathing comfortably, speaking in full sentences.  Head/eyes: Unremarkable  Skin: intact, no lesions  Neuro: AAOx4, sensation intact to light touch in all 4 extremities  Musculoskeletal: Limited R wrist extension  Abdominal: Benign      Assessment and Plan:  This is a 25 year old male with a history of FOOSH on R hand in Feb 2022 seen at [facility] for R scaphoid nonunion fx.  Dr. [facility], Sabino Snipes, MD requested that the regional service evaluate the patient and their comorbidities to determine whether regional anesthesia interventions for acute pain management would have a favorable risk to benefit ratio for the patient.    Assessment: Based on the risks, benefits, and alternatives, the patient is an appropriate candidate for a regional anesthesia procedure.    Recommendations:   Pain/  Neuro:        - A right infraclavicular block was discussed with the patient with possible placement of a continuous catheter. This intervention would likely result in multiple benefits for the patient, including decreasing acute pain and minimizing opioid consumption.      - Weight-bearing status after procedure: Per surgery team.  CV: hemodynamically stable  Resp: stable and is not showing signs of oversedation.   FEN/GI: diet is NPO pending surgery  Heme/ID: afebrile, WBC wnl, platelets stable  Anticoagulation/DVT prophylaxis: SCDs in OR.      Risks, benefits and alternatives of the procedure were discussed, including but not limited to bleeding, infection, nerve injury, incidental nerve blocks, damage to nearby structures, reaction to medications administered, respiratory complications, and local anesthetic systemic toxicity. Questions answered and patient verbalized understanding.    Plan: Patient  Decision: Declines nerve block    Patient discussed with Dr. Vassie Loll, who agrees with my assessment and plan.    Thank you for this consult. Please contact the regional anes fellow/resident on-call for questions [phone].  Note Author: Mliss Fritz    REGIONAL ANESTHESIA ATTENDING CONSULT NOTE ATTESTATION    Subjective    I have reviewed the patient's history.  Randy Erickson is a 25 year old male for whom Dr. Sabino Snipes, Beverley Fiedler, MD requested regional anesthesia service consult for anticipated postoperative pain.     Objective    I have examined the patient and concur with the resident/fellow exam.    Assessment and Plan    I agree with the resident/fellow care plan and have edited the documented consult note as necessary.    See the resident / fellow note for further details.    Time spent counseling or coordinating of care for this patient: 25 minutes    Note Author: Peyton Bottoms, MD

## 2021-09-21 NOTE — Plan of Care (Signed)
Problem: Promotion of Perioperative Health and Safety  Goal: Promotion of Health and Safety of the Perioperative Patient  Description: The patient remains safe, receives treatment appropriate to the surgical intervention and patient's physiological needs and is discharged or transferred to the appropriate level of care.    Information below is the current care plan.  Outcome: Progressing  Flowsheets (Taken 09/21/2021 1008)  Guidelines: PACU  Individualized Interventions/Recommendations #1: Maintain and position for comfort and safety  Individualized Interventions/Recommendations #2 (if applicable): Ice and elevate  Individualized Interventions/Recommendations #3 (if applicable): Pain control

## 2021-09-21 NOTE — Discharge Instructions (Addendum)
After Hand or Arm Surgery Instructions     1. Once you are fully awake from your anesthetic you will be allowed to leave the hospital. You will need someone to drive you home.     2. Raise your operative arm above the heart to limit swelling and help to decrease pain (fingers pointed to the sky).     3. If you had a nerve block placed to your arm or hand during surgery, the feeling and control of your operative arm may not return for up to 24 hours or longer if you have a catheter. You must take care not to injure your arm or hand during this time. You will be given a sling to wear until you have control of your arm. If your arm is still numb when you get ready to sleep for the night, sleep with the sling on. Sleep on your back or the non-operative side. Support your arm on a pillow.     4. You may begin icing the operative arm after the feeling returns. Put the ice pack on for 20 minutes out of each hour while you are awake. Do not let the bandage get wet. You may use the ice for up to one week.     5. Leave your splint intact until you are seen by your surgeon or by hand therapy. Elevate your hand with fingers towards the sky to minimize swelling, and move your exposed fingers as much as the splint will allow to keep them from getting stiff.  You may use your fingers for typing/pinching and other light activity, but should refrain from any lifting.     6. You will need to see your surgeon in the clinic in 7 to 14 days (unless otherwise told by your doctor). If you have not been given a follow-up appointment when you leave the hospital, please call the clinic where you are receiving your care and schedule an appointment.    7. Your doctor will give you medicine for pain relief. Take the pain medicine as ordered if you are having pain. Be sure to take any pain medicine with food in your stomach. This will help keep the medicine from upsetting your stomach. If you have been given a pain pump  to use for postoperative pain control, please review the printed instructions that are in the bag containing the pain pump.       FOR YOUR PAIN MEDICATION:  Remember!!! You only need to take the minimal amount needed to decrease your pain so that it is tolerable. The goal is not to eliminate pain, only to make it manageable.     NSAIDS plus Tylenol have been shown to have equivalent pain reducing effects as taking Percocet, and also helps to reduce swelling and inflammation that can make pain after injury or surgery worse. For this reason, we strongly recommend taking this combination and limiting the use of opioids to ONLY as needed, such as before bed to help you get more comfortable for sleeping.    You had a pain block which should keep you comfortable and pain free for several hours after surgery.  - Take 1000 mg Tylenol (2 tabs Extra Strength) tonight as the block starts wearing off. Wait 1-2 hours. If your pain worsens during that time, take 1 tab oxycodone before bed.   - If your arm is still numb when you are ready to go to sleep, take 1000 mg Tylenol + 1 tab Oxycodone to prevent  waking when the block wears off.   - Starting tomorrow morning, start taking an NSAID like Ibuprofen (Advil, Motrin) 600 mg every 6 hours OR Naprosyn (Aleve) 500 mg every 12 hours with food plus 1-2 tabs ES Tylenol every 4-6 hours. Take this combination for at least the first 5 days after surgery, and then you can decrease as tolerated.   - Do not exceed 4000 mg Tylenol from all sources.  - if Ibuprofen and Tylenol are not effective at making your pain tolerable, you can add in 1 tab Oxycodone every 6 hours if needed.  - Taking any pain medication with a little food may help to prevent nausea.   - If your pain is uncontrolled with Tylenol + Ibuprofen every 6 hours, call the Orthopedic clinic to speak with a nurse about trying an alternative medicine.  - Drink plenty of fluids while you are taking the pain medicine. Additionally,  consider taking a stool softener such as Colace or supplement like Metamucil daily while taking the pain medicine routinely to prevent constipation.       Examples of safe pain medication combinations. Start with the top line and increase as needed until adequate pain control is achieved:  600 mg Ibuprofen + 500 mg Tylenol every 6 hours  600 mg Ibuprofen + 1000 Tylenol every 8 hours (3 times a day)  600 mg Ibuprofen + 1000 Tylenol every 6 hours (4 times a day)  -- if these combinations are ineffective at making your pain tolerable, add in 1 tab Oxycodone every 6-8 hours as needed.      8. Wear loose fitting clothing, especially on the neck and sleeves.     9. You may eat and drink normally once you are fully awake from the anesthesia.     10. Call your surgeon if:        Pain initially decreases, but later increases      You develop a fever or chills     Your surgery site becomes red, hot or swollen     If you have any questions or concerns, please call your doctor or the Orthopedic Clinic where you are receiving your care. After clinic hours, call the Page Operator and ask for the Hand Surgery Team.     [facility] [phone]   [address] [place]     Outpatient Orthopedic Clinic [phone]   [facility], [facility] Page Operator [phone]   Ask for the Hand Surgery Team       When the block wears off- be sure to move your fingers as much as possible. Below are exercises you can start doing right away:

## 2021-09-21 NOTE — H&P (Signed)
HISTORY & PHYSICAL - INTERVAL ASSESSMENT  Randy Erickson  [id number]    This interval H&P update references the history and physical documentation from this date:  09/04/21    Current Medical Status:  Unchanged    Medications / Allergies:  Unchanged    Review of Systems:  Unchanged    Physical Examination:  BP 123/89 (BP Location: Left arm, BP Patient Position: Sitting)    Pulse 69    Temp 97.7 F (36.5 C)    Resp 16    Ht '6\' 2"'  (1.88 m)    Wt 75.5 kg (166 lb 8 oz)    SpO2 97%    BMI 21.38 kg/m    I have examined the patient today.  Unchanged    Laboratory or Clinical Data:  No results found for: CO19, COV19RAASSAY  No results found for: NA, K, CL, BICARB, BUN, CREAT, GLU, [place]  No results found for: WBC, RBC, HGB, HCT, MCV, MCHC, RDW, PLT, MPV, SEG, LYMPHS, MONOS, EOS, BASOS  No results found for: AST, ALT, GGT, LDH, ALK, TP, ALB, TBILI, DBILI  No results found for: INR, PTT      Modifications of Initial Care Plan:  The care plan has been discussed with the attending physician of record and remains unchanged. This was discussed with the patient today on the morning of the procedure. The patient understands the current plan and verbally consents to the proposed treatment.     Tayjah Lobdell L Shervin Cypert     09/21/21     8:13 AM

## 2021-09-22 NOTE — Op Note (Signed)
DATE OF SERVICE:  09/21/2021    PREOPERATIVE DIAGNOSIS:  Right scaphoid nonunion.    POSTOPERATIVE DIAGNOSIS:  Right scaphoid nonunion.    PROCEDURE PERFORMED:  Open treatment of right scaphoid nonunion with  autogenous distal radius bone graft.     SURGEON/STAFF:  Hali Marry, MD    ASSISTANT:  Aldine Contes, PA, as no qualified resident was available.    ANESTHESIA:  Infraclavicular block with laryngeal mask airway  supplementation.     COMPLICATIONS:  None.    SUMMARY OF CASE:  This is a 25 year old male who had fallen,  sustaining a left scaphoid injury.  Injury is approximately one year  old.  Preoperative x-rays were consistent with sclerosis of the  proximal pole of the scaphoid.  Options were discussed with the  patient, and he wished to proceed with surgical stabilization of his  fracture.  The operative site was identified and signed in the  operative holding area.  Consent was confirmed in the operative  holding area.  He was brought into the operating room and positioned  supine on our operative table.  Infraclavicular block was placed by  the Department of Anesthesia.  Preoperative time-out was performed  and preoperative antibiotics were given.  Laryngeal mask airway was  then placed.  Following time-out, sterile prep and drape was  performed in the usual fashion with DuraPrep.  Following prep and  drape, the arm was exsanguinated and the tourniquet was inflated to  250 mmHg.     Following inflation of the tourniquet, a hockey-stick type incision  was made over the volar aspect of the right wrist.  The dissection  was carried down through the soft tissue with good hemostasis.  The  radial artery was identified and protected.  The volar branch was  identified and cauterized.  Once this was completed, the arthrotomy  was made.  The radioscaphocapitate ligament was identified and  incised sharply.  The scaphoid was then exposed along with the  scaphotrapezial joint.  Once this was completed, the  wrist was  extended exposing the fracture site.  The fracture site was then  identified and was opened.  Curettes were used to debride fibrinous  tissue from the nonunion site.  Length adjustment was identified and  there was approximately 4 mm of collapse of the scaphoid in the volar  plane.  Once this was confirmed, a laminar spreader was introduced  and the proximal and distal poles of the scaphoid were debrided to  cancellous bone.  Once this was confirmed, the incision was extended  proximally.  The pronator quadratus was incised and the volar cortex  of the radius was elevated.  The cancellous bone was then curetted  from the distal radius.  Once adequate bone had been harvested, the  bone was impacted into the resulting voided scaphoid.  Once the bone  was impacted into the scaphoid, the volar cortex of the radius was  used as a volar strap for the volar surface of the scaphoid.  This  was then placed.  A provisional guide pin from the [employer] 2.5 mm  compression screw was then selected and fluoroscopy was used to  confirm the position of the   screw as well as reduction of the scaphoid.  Once these were  confirmed, a 24 mm screw was selected.  The screw was then placed  from distal to proximal with good compression at the fracture site.  The screw heads were buried.     DICTATION  ENDS HERE    Job #:  [id number]

## 2021-09-27 ENCOUNTER — Encounter (HOSPITAL_BASED_OUTPATIENT_CLINIC_OR_DEPARTMENT_OTHER): Payer: Self-pay | Admitting: Physician Assistant

## 2021-09-27 DIAGNOSIS — S62021K Displaced fracture of middle third of navicular [scaphoid] bone of right wrist, subsequent encounter for fracture with nonunion: Secondary | ICD-10-CM

## 2021-10-02 ENCOUNTER — Encounter (INDEPENDENT_AMBULATORY_CARE_PROVIDER_SITE_OTHER): Payer: Self-pay | Admitting: Hand Surgery

## 2021-10-02 DIAGNOSIS — Z9889 Other specified postprocedural states: Secondary | ICD-10-CM

## 2021-10-04 ENCOUNTER — Ambulatory Visit (INDEPENDENT_AMBULATORY_CARE_PROVIDER_SITE_OTHER): Payer: MEDICAID | Admitting: Hand Surgery

## 2021-10-04 ENCOUNTER — Other Ambulatory Visit (INDEPENDENT_AMBULATORY_CARE_PROVIDER_SITE_OTHER): Admit: 2021-10-04 | Discharge: 2021-10-04 | Disposition: A | Discharge status: Home Health | Payer: MEDICAID

## 2021-10-04 DIAGNOSIS — Z09 Encounter for follow-up examination after completed treatment for conditions other than malignant neoplasm: Secondary | ICD-10-CM

## 2021-10-04 DIAGNOSIS — S62021K Displaced fracture of middle third of navicular [scaphoid] bone of right wrist, subsequent encounter for fracture with nonunion: Secondary | ICD-10-CM

## 2021-10-04 DIAGNOSIS — S62021D Displaced fracture of middle third of navicular [scaphoid] bone of right wrist, subsequent encounter for fracture with routine healing: Secondary | ICD-10-CM

## 2021-10-04 DIAGNOSIS — Z967 Presence of other bone and tendon implants: Secondary | ICD-10-CM

## 2021-10-04 NOTE — Interdisciplinary (Signed)
Procedure: Suture tail trimmed flush to the skin. Short Arm Cast Applied: TYPE: Thumb spica  IP Free, portal included for bone stimulator. Patient, family member, and/or caregiver understand the use and application of this item. They are aware that should the cast/brace result in increased pain, decreased sensation, increased swelling, or an overall worsening of their medical condition, to please contact our office immediately.      Location: Right Upper Extremity  Instruction/Education Provided: yes    Ordering Provider: Hali Marry, MD    The patient's questions and concerns were addressed, and the patient left the clinic satisfied.  The patient will follow up as instructed by the provider.

## 2021-10-07 ENCOUNTER — Encounter (HOSPITAL_BASED_OUTPATIENT_CLINIC_OR_DEPARTMENT_OTHER): Payer: Self-pay | Admitting: Hand Surgery

## 2021-10-19 NOTE — Progress Notes (Signed)
Randy Erickson returns for evaluation of his right wrist following open treatment of scaphoid nonunion.  He is stable.  Pain is minimal.      Examination shows his surgical incision has healed without complications.  He has full digital flexion and extension.  EPL function is intact.  Distal neurovascular exam is intact.      Radiographs demonstrate maintenance of alignment of his scaphoid fracture following open treatment with bone grafting.    Treatment plan:  He is stable.  Sutures are clipped today.  He is placed into a short-arm thumb spica cast.  Ultrasound bone stimulator is ordered.  He will follow up in six weeks for re-evaluation with repeat radiographs out of the cast.

## 2023-01-14 ENCOUNTER — Encounter (HOSPITAL_BASED_OUTPATIENT_CLINIC_OR_DEPARTMENT_OTHER): Payer: Self-pay | Admitting: Family Medicine

## 2023-01-14 DIAGNOSIS — M25562 Pain in left knee: Secondary | ICD-10-CM

## 2023-01-15 ENCOUNTER — Encounter: Payer: Self-pay | Admitting: Family Medicine

## 2023-01-15 DIAGNOSIS — M25562 Pain in left knee: Secondary | ICD-10-CM

## 2023-01-16 ENCOUNTER — Encounter: Payer: Self-pay | Admitting: Family Medicine

## 2023-01-16 DIAGNOSIS — J342 Deviated nasal septum: Secondary | ICD-10-CM

## 2023-01-23 ENCOUNTER — Encounter (HOSPITAL_BASED_OUTPATIENT_CLINIC_OR_DEPARTMENT_OTHER): Payer: Self-pay | Admitting: Hand Surgery

## 2023-01-23 DIAGNOSIS — M25531 Pain in right wrist: Secondary | ICD-10-CM

## 2023-01-28 ENCOUNTER — Ambulatory Visit: Payer: Medicaid Other | Admitting: Hand Surgery

## 2023-01-28 ENCOUNTER — Encounter (INDEPENDENT_AMBULATORY_CARE_PROVIDER_SITE_OTHER): Payer: Self-pay | Admitting: Hospital

## 2024-02-13 ENCOUNTER — Emergency Department (HOSPITAL_COMMUNITY)

## 2024-02-13 ENCOUNTER — Emergency Department: Admission: EM | Admit: 2024-02-13 | Discharge: 2024-02-13 | Disposition: A | Discharge status: Home Health

## 2024-02-13 DIAGNOSIS — M25539 Pain in unspecified wrist: Secondary | ICD-10-CM

## 2024-02-13 DIAGNOSIS — M25531 Pain in right wrist: Secondary | ICD-10-CM | POA: Insufficient documentation

## 2024-02-13 DIAGNOSIS — G8929 Other chronic pain: Secondary | ICD-10-CM | POA: Insufficient documentation

## 2024-02-13 DIAGNOSIS — S62001A Unspecified fracture of navicular [scaphoid] bone of right wrist, initial encounter for closed fracture: Secondary | ICD-10-CM

## 2024-02-13 NOTE — ED Provider Notes (Signed)
 ED Provider Note  [place] electronic medical record reviewed for pertinent medical history.     Eleazar Grief DOB: 03-29-1996 PMD: [facility], [facility]     ED Arrival Information       Expected   -    Arrival   02/13/2024 10:01    Acuity   Semi-Urgent/ESI Level 4              Means of arrival   Walk in    Escorted by   None    Service   ED Medicine    Admission type   Emergency              Arrival complaint   Wrist injury             Chief Complaint   Patient presents with    Wrist Pain     Ambulatory to ER with cc progressive R wrist pain. Pt reports having broken R wrist 3 years ago and having screws placed during surgery. Has since been having continued pain and had a follow up xray that pt states showed "dead bone". Pt now reporting pain is worse and he is unable to work due to the pain and wants xray to see if screw is floating in wrist. Not taking any medications for pain at this time.        HPI: Nataniel Gasper is a 28 year old male who has a past medical history of COVID-19 vaccination not done and COVID-19 virus infection (04/2021). Here with chronic pain in the R wrist where he has known loose hardware and "dead bone".  He has had MRI and seen his hand surgeon who said there was nothing that could be done about it.  Pt is RHD and cannot work due to the pain/swelling.  Here requesting a 2nd opinion.          External Data Sources (Select all that apply):  specialty clinic note from (institution & date) hand surgery [facility] 10/04/21. Information obtained: as below.    Mr. Jutras returns for evaluation of his right wrist following open treatment of scaphoid nonunion.  He is stable.  Pain is minimal.       Examination shows his surgical incision has healed without complications.  He has full digital flexion and extension.  EPL function is intact.  Distal neurovascular exam is intact.       Radiographs demonstrate maintenance of alignment of his scaphoid fracture following open treatment  with bone grafting.    Pertinent Medical History:    PMHx: As above    Past Surgical History:   Procedure Laterality Date    hand Right 2010       No family history on file.    Current Outpatient Medications   Medication Instructions    acetaminophen (TYLENOL) 500 MG tablet Take 1-2 tabs every 6 hours as needed. Do not exceed 4000 mg in 24 hours from all sources.    ibuprofen (MOTRIN) 600 mg, Oral, EVERY 6 HOURS PRN    oxyCODONE (ROXICODONE) 5 mg, Oral, EVERY 6 HOURS PRN       Physical Exam  BP 132/84   Pulse 59   Temp 97.8 F (36.6 C)   Resp 18   Ht 6\' 2"  (1.88 m)   Wt 89.8 kg (198 lb)   SpO2 97%   BMI 25.42 kg/m   Physical Exam  Mild swelling of R wrist w/o overlying skin changes  +2 radial  pulse  Decreased flexion/extension of wrist    Orders/Medications    Orders Placed This Encounter   Procedures    X-Ray Wrist Complete Minimum 3 Vws - Right    Consult/Referral to Orthopedics       Medications - No data to display        Medical Decision Making/Assessment/Plan    This is a(n) 28 year old male who has a past medical history of COVID-19 vaccination not done and COVID-19 virus infection (04/2021). and presents with chronic R wrist pain/swelling after known loose hardware and bone ischemia, requesting 2nd opinion.  No new injury.  Vs wnl.  Exam with R wrist swelling and decreased ROM.  Repeat XR, likely referral to ortho/hand.    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 These factors potentially limit the patient's ability to obtain adequate follow up and/or comply with treatment.    ED Course/Updates/Disposition  ED Course as of 02/13/24 1255   Geronimo Krabbe Darsha's Documentation   Thu Feb 13, 2024   1221 X-Ray Wrist Complete Minimum 3 Vws - Right  IMPRESSION:  Lucency surrounding the scaphoid fixation screw with slightly proud positioning compared to 2022, consistent with loosening.     Nonunited scaphoid waist fracture  with increased sclerosis of the proximal fracture fragment compared to 2022, concerning for ischemia.           ED Clinical Impression as of 02/13/24 1255   Wrist pain with old scaphoid fracture determined by x-ray     - XR snowing nonunion and bone ischemia.  Will refer to ortho/hand                      Data Reviewed:      Limited Bedside Ultrasound Procedure:   All images archived on [facility] servers.        Risk of Complications and/or Morbidity:                 Sundra Engel, DO  02/13/24 1719

## 2024-02-13 NOTE — ED Notes (Signed)
 Dr. Wilhelmenia Harada with pt discussing pt results and POC.

## 2024-02-21 ENCOUNTER — Encounter (HOSPITAL_BASED_OUTPATIENT_CLINIC_OR_DEPARTMENT_OTHER): Payer: Self-pay | Admitting: Hand Surgery

## 2024-02-21 DIAGNOSIS — M25531 Pain in right wrist: Secondary | ICD-10-CM

## 2024-02-24 ENCOUNTER — Ambulatory Visit: Attending: Hand Surgery | Admitting: Hand Surgery

## 2024-02-24 DIAGNOSIS — M25539 Pain in unspecified wrist: Secondary | ICD-10-CM | POA: Insufficient documentation

## 2024-02-24 DIAGNOSIS — S62021K Displaced fracture of middle third of navicular [scaphoid] bone of right wrist, subsequent encounter for fracture with nonunion: Secondary | ICD-10-CM | POA: Insufficient documentation

## 2024-02-24 DIAGNOSIS — Z8781 Personal history of (healed) traumatic fracture: Secondary | ICD-10-CM | POA: Insufficient documentation

## 2024-02-24 NOTE — Patient Instructions (Signed)
 Please call [facility] to schedule your surgery at your earliest convenience.     Lestine Mount  Surgery Coordinator  [phone]

## 2024-02-26 ENCOUNTER — Telehealth (HOSPITAL_BASED_OUTPATIENT_CLINIC_OR_DEPARTMENT_OTHER): Payer: Self-pay | Admitting: Hand Surgery

## 2024-02-26 NOTE — Progress Notes (Signed)
 Randy Erickson was last seen on September 21, 2021 following open treatment for scaphoid nonunion. He did not return for further follow-ups.  He presents today for evaluation and treatment. He reports persistent wrist pain although pain is reported as 0/10 at rest.    Examination demonstrates full digital flexion and extension. He has wrist flexion to 55 and wrist extension to 60. He has full forearm rotation. He reports pain throughout the maneuvers.    CT scan demonstrates established nonunion of his scaphoid. He has a fixation screw in the scaphoid.    Randy Erickson returns for follow-up of his scaphoid fracture 2-1/2 years after his last visit. He has signs of a persistent nonunion of the scaphoid.  He wishes to proceed with surgical management of the scaphoid nonunion.  We will obtain a CT scan to evaluate the anatomy of his scaphoid. He is given the number for scheduling. He will contact the office for surgical scheduling.

## 2024-02-26 NOTE — Telephone Encounter (Addendum)
 4/28 t/c and scheduled surgery for 5/27. Pt understands all associated appts will be scheduled and a letter will be sent US  Mail    4/23 r/c to patient and let him know I don't have all necessary pw from his 4/21 office visit. Message sent to [facility]. I will call patient once I have what is needed.

## 2024-03-04 ENCOUNTER — Encounter (HOSPITAL_BASED_OUTPATIENT_CLINIC_OR_DEPARTMENT_OTHER): Payer: Self-pay | Admitting: Hand Surgery

## 2024-03-04 ENCOUNTER — Telehealth (HOSPITAL_BASED_OUTPATIENT_CLINIC_OR_DEPARTMENT_OTHER): Payer: Self-pay | Admitting: Hand Surgery

## 2024-03-04 NOTE — Telephone Encounter (Signed)
 Randy Erickson            02-01-1996                [id number]    APC Triage Tool Score:   Translation Needed: No  DOS: 5/27 Right repair fracture, nonunion, scaphoid, iliac crest bone graft   Surgeon: Arnulfo Bidding  Status (Inpatient, Outpatient, Obs): outpt      Patient needs APC appointment 2-5 weeks from DOS.

## 2024-03-05 NOTE — Telephone Encounter (Signed)
 Thank you for reaching out regarding your patient's APC appointment request. An APC RN phone call has been made. Patient is aware of appt date and time. The scheduled appointment information is in Epic.     Thank you,  APC Team Members

## 2024-03-11 ENCOUNTER — Ambulatory Visit (HOSPITAL_BASED_OUTPATIENT_CLINIC_OR_DEPARTMENT_OTHER): Admit: 2024-03-11

## 2024-03-24 ENCOUNTER — Ambulatory Visit (INDEPENDENT_AMBULATORY_CARE_PROVIDER_SITE_OTHER)

## 2024-03-24 ENCOUNTER — Encounter (INDEPENDENT_AMBULATORY_CARE_PROVIDER_SITE_OTHER): Payer: Self-pay

## 2024-03-24 DIAGNOSIS — Z01818 Encounter for other preprocedural examination: Secondary | ICD-10-CM

## 2024-03-24 NOTE — Patient Instructions (Signed)
 PREOPERATIVE SURGICAL INFORMATION    Your surgery is currently scheduled at [facility] on 03/31/2024  The scheduler will be contacting you with the check in time                                                                                 FACILITY ADDRESS:    [facility], [address] [place], [place] [address]  Check in and Operating room is down the elevator on the lower level (LL)    PARKING:    [facility]: [employer], Adult nurse structure, or Scientist, water quality parking (7am-5pm at [employer] entrance and [employer] Chemical engineer; 5am-5pm at [employer] by Emergency room/Labor and Delivery) for same cost as self-parking.  [email or web address]       QUESTIONS:    If you have any questions between now and the day of your surgery, please do not hesitate to call:     [facility] Anesthesia Preparedness Clinic: [phone]   [facility] Anesthesia Preparedness clinic: ( medical office [place] building) [phone]  [facility] Outpatient Enis Harsh [phone]      DAY OF SURGERY ARRIVAL TIME:    On the day of your Surgery/Procedure, please arrive at the time provided by the surgeon's clinic.  [facility] surgery Pre-op team will contact you the day before surgery.  Patients are limited to 2 visitors over the age of 1        MEDICATION INSTRUCTIONS BEFORE SURGERY/PROCEDURE:       PLEASE HOLD ALL NSAIDS (non-steroidal anti-inflammatory drugs) SUCH AS advil, aleve, motrin, ibuprofen, relafen, lodine, feldene, Diclofenac, voltaren, indomethacin, naproxen, celebrex, Mobic 7 days before surgery. Please do not take products that contain aspirin including Excedrin, Bufferin, Bayer, Alka-Seltzer, Asacol, Fiorinal, Pepto-Bismol (these are just the most common, please check labels to be sure) for 7 days prior to procedure.       Please hold vitamins, supplements, CO-Q10, Glucosamine, herbs & fish oil 7 days before surgery. Vitamin C, D are ok to take.     It  is OK to take acetaminophen (Tylenol) for pain around the time of surgery unless you have liver disease.      AFTER YOUR VISIT WITH US , IF YOU START TAKING A NEW MEDICATION BEFORE SURGERY, PLEASE CALL US  TO MAKE SURE IT IS SAFE TO TAKE & WILL NOT AFFECT YOUR SURGERY.              EATING/DRINKING:    NPO instructions:    No solid food after midnight on the day of surgery    OK?to have water, black coffee (NO cream), apple juice, or cranberry juice up to 3 hours prior to surgery time       Please only choose drinks from this list. Everything else is NOT allowed        Preparing for your Surgery:     Please wear clean loose-fitting clothes and leave valuables at home   Do not shave or remove body hair. Facial shaving is permitted. If you are having head/face surgery, ask your surgeon's office whether you can shave.  Bring a picture ID and your insurance card, and be prepared to pay your deductible or co-insurance by cash,  check, or credit card when you arrive.   All patients [facility] go home after their surgery, Please make sure to arrange for an adult to drive you home. You CANNOT use UBER or LYFT. If you do not have a ride, your surgery may be cancelled.    If you are on home oxygen and have a portable oxygen tank, bring it with you on the day of the surgery to use when you are discharged to go home.     On The Day of Your Surgery:      Check in at the location mentioned above   If you are a woman of child bearing age, please note that you may be asked to give a urine sample upon check-in  You will meet your anesthesia and surgery teams in the preoperative holding area before surgery.   Once surgery is over, you will wake up in the recovery room.  An adult chaperone will need to stay with you for the first 24 hours after surgery.   Visitor policy during the COVID-19 pandemic is subject to change.  No more that two visitors are allowed per patient. Current visitor policy can be found at  [email or web address]      A video about what to expect for the day of surgery can be found here:    [email or web address]  Or by searching "You-tube" for [place] before surgery and [place] after surgery     Your medical records are available to you at [email or web address] click sign up now.

## 2024-03-24 NOTE — Interdisciplinary (Signed)
 Anesthesia Preparedness Clinic [facility]) RN PHONE CALL NOTE    Phone call to patient from [facility] RN today.     Confirmed medical history & that medications listed in Epic are accurate and up to date. See Pre eval notes.    Patient scheduled for surgery on 03/31/2024 at [facility].    Confirmed preoperative instructions with patient including NPO instructions.     Planning your surgery information sent to patient via mychart.    No questions or concerns at this time.

## 2024-03-24 NOTE — Anesthesia Preprocedure Evaluation (Addendum)
 ANESTHESIA PRE-OPERATIVE EVALUATION    Patient Information    Name: Randy Erickson    MRN: [id number]    DOB: 04/04/1996    Age: 28 year old    Sex: male  Procedure(s):  Right repair fracture, nonunion, scaphoid, iliac crest bone graft      Pre-op Vitals:   BP 135/79 (BP Location: Left arm, BP Patient Position: Sitting)   Pulse 70   Temp 36.2 C   Resp 16   Ht 6' 2 (1.88 m)   Wt 84 kg (185 lb 3 oz)   SpO2 97%   BMI 23.78 kg/m         Primary language spoken:  English    ROS/Medical History:          General:  negative for General ROS  Pt denies any cardiac or pulmonary condition, able to walk > 4 blocks and can climb up 2 flights of stairs without CP or SOB.    Pt reported that he is not taking any prescription, OTC, supplements or any herbal medication at this time. Cardiovascular:     Anesthesia History:  negative anesthesia history ROS   Pulmonary:   negative pulmonary ROS     Neuro/Psych:   negative neuro/psych ROS   Hematology/Oncology:   hematologic/lymphatic negative      GI/Hepatic:  negative GI/hepatic ROS Infectious Disease:  negative for infectious disease     Renal:  negative renal ROS   Endocrine/Other:  negative endo/other ROS,      Pregnancy History:   Pediatrics:         Pre Anesthesia Testing (PCC/CPC) notes/comments:                 Physical Exam    Airway:      Comment: beard Inter-inciser distance > 4 cm  Prognanth Able    Mallampati: II  Neck ROM: full  TM distance: 5-6 cm  Short thick neck: No          Cardiovascular:  - cardiovascular exam normal    Rhythm: regular   Rate: normal          Pulmonary:  - pulmonary exam normal       breath sounds clear to auscultation          Neuro/Neck/Skeletal/Skin:  - Neck/Neuro/Skeletal/Skin exam normal          Dental:  - normal exam    Abdominal:   - normal exam     General: normal weight      Additional Clinical Notes:               Last OSA (STOP BANG) Score:   Has a physician diagnosed you with sleep apnea?: No  Do you use a CPAP at home?:  No  Do you snore loudly (loud enough to be heard through a closed door)?: 0  Do you often feel tired, fatigued or sleepy during the day?: 0  Has anyone observed that you stop breathing while you are sleeping?: 0  Have you ever been treated for high blood pressure?: 0  OSA total score (A score of 2 or more is high risk. Offer patient sleep study.): 0      Past Medical History:   Diagnosis Date    COVID-19 vaccination not done     COVID-19 virus infection 04/2021    not hospitalized; denies lingering symptoms/sequelae     Past Surgical History:   Procedure Laterality Date    hand Right 2010  none     Socioeconomic History    Marital status: Single   Tobacco Use    Smoking status: Never    Smokeless tobacco: Never   Vaping Use    Vaping status: Every Day    Substances: Nicotine   Substance and Sexual Activity    Alcohol use: Not Currently     Alcohol Use: Not on file       Current Outpatient Medications   Medication Sig Dispense Refill    acetaminophen  (TYLENOL ) 500 MG tablet Take 1-2 tabs every 6 hours as needed. Do not exceed 4000 mg in 24 hours from all sources. 90 tablet 0    ibuprofen  (MOTRIN ) 600 MG tablet Take 1 tablet (600 mg) by mouth every 6 hours as needed for Mild Pain (Pain Score 1-3) or Moderate Pain (Pain Score 4-6). 60 tablet 1    oxyCODONE  (ROXICODONE ) 5 MG immediate release tablet Take 1 tablet (5 mg) by mouth every 6 hours as needed for Severe Pain (Pain Score 7-10). 12 tablet 0     No Known Allergies    Labs and Other Data  No results found for: NA, SODIUM, K, CL, BICARB, BUN, CREAT, GLU, CA  No results found for: AST, ALT, GGT, LDH, ALK, TP, ALB, TBILI, DBILI  No results found for: WBC, RBC, HGB, HCT, MCV, MCHC, RDW, PLT, PLCTEL, MPV, MPVH, SEG, LYMPHS, MONOS, EOS, BASOS  No results found for: INR, PTT  No results found for: ARTPH, ARTPO2, ARTPCO2    Anesthesia Plan:  Risks and Benefits of Anesthesia  I have personally  performed an appropriate pre-anesthesia physical exam of the patient (including heart, lungs, and airway) prior to the anesthetic and reviewed the pertinent medical history, drug and allergy history, laboratory and imaging studies and consultations.   I have determined that the patient has had adequate assessment and testing.  I have validated the documentation of these elements of the patient exam and/or have made necessary changes to reflect my own observations during my pre-anesthesia exam.  Anesthetic techniques, invasive monitors, anesthetic drugs for induction, maintenance and post-operative analgesia, risks and alternatives have been explained to the patient and/or patient's representatives.    I have prescribed the anesthetic plan:         Planned anesthesia method: General         ASA 1 (Healthy)       No Beta Blocker Indicated:     Planned monitoring method: Routine monitoring    Informed Consent:  Anesthetic plan and risks discussed with Patient.

## 2024-03-27 ENCOUNTER — Other Ambulatory Visit (HOSPITAL_BASED_OUTPATIENT_CLINIC_OR_DEPARTMENT_OTHER): Payer: Self-pay | Admitting: Hand Surgery

## 2024-03-27 ENCOUNTER — Encounter (HOSPITAL_COMMUNITY): Payer: Self-pay

## 2024-03-27 DIAGNOSIS — Z419 Encounter for procedure for purposes other than remedying health state, unspecified: Secondary | ICD-10-CM

## 2024-03-27 NOTE — Interdisciplinary (Signed)
 Periop Nurse Navigator called and spoke with patient (two patient identifiers confirmed). Reviewed pre op call checklist, medication list, check in time 7:30 am, surgical time 9:35 am, surgical date 03/31/2024 and check in location ([facility]). Discussed with patient that check in time is subject to change and to keep phone close by. Advised patient to bring insurance card, ID, and form of payment. NPO instructions provided. Patient verbalizes understanding.

## 2024-03-31 ENCOUNTER — Ambulatory Visit
Admission: RE | Admit: 2024-03-31 | Discharge: 2024-03-31 | Disposition: A | Discharge status: Home / Self Care | Payer: Self-pay | Attending: Hand Surgery | Admitting: Hand Surgery

## 2024-03-31 ENCOUNTER — Ambulatory Visit (HOSPITAL_BASED_OUTPATIENT_CLINIC_OR_DEPARTMENT_OTHER)

## 2024-03-31 ENCOUNTER — Encounter (HOSPITAL_BASED_OUTPATIENT_CLINIC_OR_DEPARTMENT_OTHER): Payer: Self-pay | Admitting: Hand Surgery

## 2024-03-31 ENCOUNTER — Ambulatory Visit (HOSPITAL_BASED_OUTPATIENT_CLINIC_OR_DEPARTMENT_OTHER): Admitting: Anesthesiology

## 2024-03-31 ENCOUNTER — Encounter (HOSPITAL_BASED_OUTPATIENT_CLINIC_OR_DEPARTMENT_OTHER): Admission: RE | Disposition: A | Discharge status: Home Health | Payer: Self-pay | Attending: Hand Surgery

## 2024-03-31 ENCOUNTER — Ambulatory Visit

## 2024-03-31 DIAGNOSIS — Z472 Encounter for removal of internal fixation device: Secondary | ICD-10-CM

## 2024-03-31 DIAGNOSIS — S62021K Displaced fracture of middle third of navicular [scaphoid] bone of right wrist, subsequent encounter for fracture with nonunion: Secondary | ICD-10-CM

## 2024-03-31 DIAGNOSIS — S62001K Unspecified fracture of navicular [scaphoid] bone of right wrist, subsequent encounter for fracture with nonunion: Secondary | ICD-10-CM

## 2024-03-31 DIAGNOSIS — Z8616 Personal history of COVID-19: Secondary | ICD-10-CM | POA: Insufficient documentation

## 2024-03-31 DIAGNOSIS — Z419 Encounter for procedure for purposes other than remedying health state, unspecified: Secondary | ICD-10-CM

## 2024-03-31 DIAGNOSIS — F17290 Nicotine dependence, other tobacco product, uncomplicated: Secondary | ICD-10-CM | POA: Insufficient documentation

## 2024-03-31 SURGERY — REPAIR, FRACTURE NONUNION, SCAPHOID BONE
Anesthesia: General | Site: Wrist | Laterality: Right | Wound class: Class I (Clean)

## 2024-03-31 MED ORDER — LACTATED RINGERS IV SOLN
INTRAVENOUS | Status: DC | PRN
Start: 2024-03-31 — End: 2024-03-31

## 2024-03-31 MED ORDER — LACTATED RINGERS IV SOLN
INTRAVENOUS | Status: DC
Start: 2024-03-31 — End: 2024-03-31

## 2024-03-31 MED ORDER — BACITRACIN 500 UNIT/GM EX OINT (CUSTOM)
TOPICAL_OINTMENT | CUTANEOUS | Status: DC | PRN
Start: 2024-03-31 — End: 2024-03-31
  Administered 2024-03-31: 2 via TOPICAL

## 2024-03-31 MED ORDER — AMISULPRIDE (ANTIEMETIC) 10 MG/4ML IV SOLN
10.0000 mg | Freq: Once | INTRAVENOUS | Status: AC
Start: 2024-03-31 — End: 2024-03-31
  Administered 2024-03-31: 10 mg via INTRAVENOUS
  Filled 2024-03-31: qty 4

## 2024-03-31 MED ORDER — ACETAMINOPHEN 500 MG OR TABS
ORAL_TABLET | ORAL | 0 refills | Status: DC
Start: 2024-03-31 — End: 2024-07-30

## 2024-03-31 MED ORDER — ONDANSETRON HCL 4 MG/2ML IV SOLN
4.0000 mg | Freq: Once | INTRAMUSCULAR | Status: DC | PRN
Start: 2024-03-31 — End: 2024-03-31
  Administered 2024-03-31: 4 mg via INTRAVENOUS
  Filled 2024-03-31: qty 2

## 2024-03-31 MED ORDER — NALOXONE HCL 0.4 MG/ML IJ SOLN
0.1000 mg | INTRAMUSCULAR | Status: DC | PRN
Start: 2024-03-31 — End: 2024-03-31

## 2024-03-31 MED ORDER — LIDOCAINE-EPINEPHRINE 1 %-1:100000 IJ SOLN
INTRAMUSCULAR | Status: DC | PRN
Start: 2024-03-31 — End: 2024-03-31
  Administered 2024-03-31: 8 mL

## 2024-03-31 MED ORDER — LIDOCAINE-EPINEPHRINE 1 %-1:100000 IJ SOLN
INTRAMUSCULAR | Status: AC
Start: 2024-03-31 — End: 2024-03-31
  Filled 2024-03-31: qty 20

## 2024-03-31 MED ORDER — LIDOCAINE HCL 2 % IJ SOLN WRAPPED RECORD
INTRAMUSCULAR | Status: DC | PRN
Start: 2024-03-31 — End: 2024-03-31
  Administered 2024-03-31: 100 mg via INTRATHECAL

## 2024-03-31 MED ORDER — LIDOCAINE HCL (PF) 1 % IJ SOLN
0.1000 mL | Freq: Once | INTRAMUSCULAR | Status: DC | PRN
Start: 2024-03-31 — End: 2024-03-31

## 2024-03-31 MED ORDER — DOCUSATE SODIUM 250 MG OR CAPS
250.0000 mg | ORAL_CAPSULE | Freq: Two times a day (BID) | ORAL | 0 refills | Status: DC
Start: 2024-03-31 — End: 2024-07-30

## 2024-03-31 MED ORDER — BUPIVACAINE HCL (PF) 0.25 % IJ SOLN
INTRAMUSCULAR | Status: AC
Start: 2024-03-31 — End: 2024-03-31
  Filled 2024-03-31: qty 30

## 2024-03-31 MED ORDER — ACETAMINOPHEN 325 MG PO TABS
975.0000 mg | ORAL_TABLET | Freq: Once | ORAL | Status: AC
Start: 2024-03-31 — End: 2024-03-31
  Administered 2024-03-31: 975 mg via ORAL
  Filled 2024-03-31: qty 3

## 2024-03-31 MED ORDER — OXYCODONE HCL 5 MG OR TABS
5.0000 mg | ORAL_TABLET | ORAL | 0 refills | Status: AC | PRN
Start: 2024-03-31 — End: 2024-04-05

## 2024-03-31 MED ORDER — HYDROMORPHONE HCL 1 MG/ML IJ SOLN
0.5000 mg | INTRAMUSCULAR | Status: DC | PRN
Start: 2024-03-31 — End: 2024-03-31
  Administered 2024-03-31: 0.5 mg via INTRAVENOUS
  Filled 2024-03-31: qty 0.5

## 2024-03-31 MED ORDER — PROPOFOL IV BOLUS 10 MG/ML
INTRAVENOUS | Status: DC | PRN
Start: 2024-03-31 — End: 2024-03-31
  Administered 2024-03-31: 100 mg via INTRAVENOUS
  Administered 2024-03-31: 200 mg via INTRAVENOUS

## 2024-03-31 MED ORDER — FENTANYL CITRATE (PF) 100 MCG/2ML IJ SOLN
INTRAMUSCULAR | Status: AC
Start: 2024-03-31 — End: 2024-03-31
  Filled 2024-03-31: qty 2

## 2024-03-31 MED ORDER — OXYCODONE HCL 5 MG OR TABS
5.0000 mg | ORAL_TABLET | Freq: Once | ORAL | Status: DC | PRN
Start: 2024-03-31 — End: 2024-03-31

## 2024-03-31 MED ORDER — MIDAZOLAM HCL 2 MG/2ML IJ SOLN
INTRAMUSCULAR | Status: AC
Start: 2024-03-31 — End: 2024-03-31
  Filled 2024-03-31: qty 2

## 2024-03-31 MED ORDER — ONDANSETRON HCL 4 MG/2ML IV SOLN
INTRAMUSCULAR | Status: DC | PRN
Start: 2024-03-31 — End: 2024-03-31
  Administered 2024-03-31: 4 mg via INTRAVENOUS

## 2024-03-31 MED ORDER — CEFAZOLIN SODIUM 1 GM IJ SOLR
INTRAMUSCULAR | Status: DC | PRN
Start: 2024-03-31 — End: 2024-03-31
  Administered 2024-03-31: 2000 mg via INTRAVENOUS

## 2024-03-31 MED ORDER — FENTANYL CITRATE (PF) 250 MCG/5ML IJ SOLN
INTRAMUSCULAR | Status: DC | PRN
Start: 2024-03-31 — End: 2024-03-31
  Administered 2024-03-31 (×2): 50 ug via INTRAVENOUS
  Administered 2024-03-31: 100 ug via INTRAVENOUS

## 2024-03-31 MED ORDER — DEXAMETHASONE SODIUM PHOSPHATE 4 MG/ML IJ SOLN (CUSTOM)
INTRAMUSCULAR | Status: DC | PRN
Start: 2024-03-31 — End: 2024-03-31
  Administered 2024-03-31: 8 mg via INTRAVENOUS

## 2024-03-31 MED ORDER — LIDOCAINE HCL (PF) 1 % IJ SOLN
INTRAMUSCULAR | Status: AC
Start: 2024-03-31 — End: 2024-03-31
  Filled 2024-03-31: qty 30

## 2024-03-31 MED ORDER — FENTANYL CITRATE (PF) 50 MCG/ML IJ SOLN (WRAPPED RECORD) ~~LOC~~
50.0000 ug | INTRAMUSCULAR | Status: DC | PRN
Start: 2024-03-31 — End: 2024-03-31

## 2024-03-31 MED ORDER — BUPIVACAINE HCL (PF) 0.25 % IJ SOLN
INTRAMUSCULAR | Status: DC | PRN
Start: 2024-03-31 — End: 2024-03-31
  Administered 2024-03-31: 4 mL
  Administered 2024-03-31: 6 mL

## 2024-03-31 MED ORDER — FENTANYL CITRATE (PF) 50 MCG/ML IJ SOLN (WRAPPED RECORD) ~~LOC~~
25.0000 ug | INTRAMUSCULAR | Status: DC | PRN
Start: 2024-03-31 — End: 2024-03-31

## 2024-03-31 MED ORDER — DIPHENHYDRAMINE HCL 50 MG/ML IJ SOLN
12.5000 mg | Freq: Once | INTRAMUSCULAR | Status: DC | PRN
Start: 2024-03-31 — End: 2024-03-31

## 2024-03-31 SURGICAL SUPPLY — 26 items
APPLICATOR CHLORAPREP 26ML, ~~LOC~~ (Prep Solutions) ×2 IMPLANT
BENZOIN TINCTURE AMPULE STERILE (Misc Medical Supply) ×1 IMPLANT
BLADE SAGITTAL 31 X 9MM (Knives/Blades) IMPLANT
CAUTERY BOVIE ROCKER SWITCH PENCIL (Cautery) ×1 IMPLANT
CONTAINER PRECISION SPECIMEN, 4OZ- STERILE (Misc Medical Supply) ×1 IMPLANT
DRAPE HOLOGIC FLUOROSCAN (Drapes/towels) ×1 IMPLANT
DRESSING ADAPTIC 3X3 STERILE (Dressings/packing) ×1 IMPLANT
DRESSING TELFA STRL 3" X 8" (Dressings/packing) ×1 IMPLANT
GLOVE BIOGEL INDICATOR UNDERGLOVE SIZE 8 (Gloves/Gowns) ×5 IMPLANT
GLOVE SURGICAL BIOGEL SIZE 8 (Gloves/Gowns) ×4 IMPLANT
PAD GROUND VALLEYLAB REM ADULT E7507 (Cautery) ×1 IMPLANT
PAD UNDERCAST WEBRIL 4X4YD (Dressings/packing) ×1 IMPLANT
PROTECTOR ULNAR NERVE PAD, YELLOW (Patient Care Supply) ×1 IMPLANT
SCREW HEADLESS TI COMPRESSION 4.0 X 24MM ST (Screws/anchors/cables) ×1 IMPLANT
SLEEVE SCD KNEE MEDIUM (Patient Care Supply) ×1 IMPLANT
SPLINT PLASTER 4X15 (External orthotic collars/braces/supports) ×12 IMPLANT
STERI-STRIP SKIN CLOSURE 1/2 X 4" (Dressings/packing) ×3 IMPLANT
STRIP MEDI-STRIP SKIN CLOSURE 1/2 X 4" (Dressings/packing) ×1 IMPLANT
SURGICAL PACK EXTREMITY HAND (Procedure Packs/kits) ×1 IMPLANT
SUTURE ETHIBOND EXCEL 3-0 36" RB-1 (Suture) ×1 IMPLANT
SUTURE ETHILON 3-0 18" PS-2 (Suture) ×1 IMPLANT
SUTURE MONOCRYL 4-0 18" PS-2 (Y496) (Suture) ×2 IMPLANT
SUTURE VICRYL PLUS 2-0 27" CT-2 VCP269 (Suture) ×1 IMPLANT
SUTURE VICRYL PLUS 3-0 27" PS-2 VCP427H (Suture) ×2 IMPLANT
TOURNIQUET CUFF RED DISPOSABLE 18" X 4" (Misc Surgical Supply) ×1 IMPLANT
WIRE GUIDE 1.1 X 150MM TROCAR TIP (Procedural wires/sheaths/catheters/balloons/dilators) ×2 IMPLANT

## 2024-03-31 NOTE — Plan of Care (Signed)
 Problem: Promotion of Perioperative Health and Safety  Goal: Promotion of Health and Safety of the Perioperative Patient  Description: The patient remains safe, receives treatment appropriate to the surgical intervention and patient's physiological needs and is discharged or transferred to the appropriate level of care.Information below is the current care plan.  Flowsheets (Taken 03/31/2024 1122)  Guidelines: PACU  Individualized Interventions/Recommendations #1: repositioned for comfort

## 2024-03-31 NOTE — RN OR/Procedure Note (Signed)
 Explanted screw not sent to pathology per surgeon

## 2024-03-31 NOTE — Discharge Instructions (Addendum)
 After Hand or Arm Surgery Instructions           1.  Once you are fully awake from your anesthetic you will be allowed to leave the hospital. You will need someone to drive you home.          2. Raise your operative arm above the heart to limit swelling and help to decrease pain.     If you had a nerve block placed to your arm or hand during surgery, the feeling and control of your operative arm may not return for up to 24 hours. You must take care not to injure your arm or hand during this time. You will be given a sling to wear until you have control of your arm. If your arm is still numb when you get ready to sleep for the night, sleep with the sling on. Sleep on your back or the non-operative side. Support your arm on a pillow.     You may begin icing the operative arm after the feeling returns. Put the ice pack on for 20 minutes out of each hour while you are awake. Do not let the bandage get wet. You may use the ice for up to one week.      Unless otherwise told by your surgeon, leave the bandage/splint on your arm on and dry until your first follow-up visit. Keep the dressing on your right hip on until your follow up appointment.    You will need to see your surgeon in the clinic in 7 to 14 days (unless otherwise told by your doctor). If you have not been given a follow-up appointment when you leave the hospital, please call the clinic where you are receiving your care and schedule an appointment    You may need hand therapy during your recovery. For some surgeries, therapy is as important as the surgery itself. If an appointment has not been arranged before your surgery, plans for a visit to the hand therapist will be made at your first follow-up visit with your surgeon if needed.     Your doctor will give you medicine for pain relief. Take the pain medicine as ordered if you are having pain. Be sure to take any pain medicine with food in your stomach. This will help keep the medicine from upsetting your  stomach. If you have been given a pain pump to use for postoperative pain control, please review the printed instructions that are in the bag containing the pain pump.     Wear loose fitting clothing, especially on the neck and sleeves.     You may eat and drink normally once you are fully awake from the anesthesia.     11. Do not bear weight with the operative extremity until further advised at clinic follow-up.          Call your surgeon if:   Pain initially decreases, but later increases   You develop a fever   Your surgery site becomes red, hot or swollen     If you have any questions or concerns, please call your doctor or the Orthopedic Clinic where you are receiving your care. After clinic hours, call the Page Operator and ask for the Hand Surgery Team.    [facility] [place] [phone]  [address], [place]     Outpatient Orthopedic Clinic [phone]   [facility], [facility] Page Operator (  [phone]   Ask for the Hand Surgery Team

## 2024-03-31 NOTE — Anesthesia Postprocedure Evaluation (Signed)
 Anesthesia Post Note    Patient: Randy Erickson    Procedure(s) Performed: Procedure(s):  Right repair fracture, nonunion, scaphoid, with right iliac crest bone autograft      Final anesthesia type: General    Patient location: PACU    Post anesthesia pain: adequate analgesia    Mental status: awake, alert , and oriented    Airway Patent: Yes    Last Vitals:   Vitals Value Taken Time   BP 140/88 03/31/24 1200   Temp 36.2 C 03/31/24 1142   Pulse 70 03/31/24 1206   Resp 18 03/31/24 1206   SpO2 94 % 03/31/24 1206   Vitals shown include unfiled device data.     Post vital signs: stable    Hydration: adequate    N/V:no    Anesthetic complications: no    Plan of care per primary team.

## 2024-03-31 NOTE — Brief Op Note (Signed)
 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 Tourniquet time 54 mins. See detailed operative report    Specimens: * No specimens in log *    Implants:   Implant Name Type Inv. Item Serial No. Manufacturer Lot No. LRB No. Used Action   SCREW HEADLESS TI COMPRESSION 2.5 X - SN/A Screws/anchors/cables SCREW HEADLESS TI COMPRESSION 2.5 X N/A [employer] N/A Right 1 Explanted       Fluids/Blood Products:    IV Fluids: ssee anesthesia record    Blood Products: None    EBL: 20ml    Urine Output: Not recorded    Complications: None    Dispo: Stable to PACU    Event Time In   In Facility [phone]   Pre Procedure Start 0709   PreOp Nurse Complete 0738   Pre Procedure Tasks Complete 0842   Room Setup Start [phone]   Room Ready for Patient [phone]   In Room 0958   Incision 1026   Closing Started 1116   Closing Complete 1133   Out of Room 1140   Room Cleanup Start    Room Cleanup End    Holding in [place]    In PACU    PACU Criteria Complete    In Recovery    Recovery Criteria Complete    Ready For Visitors    Anesthesia Start [phone]   Anesthesia Ready 1010   Anesthesia Stop    Epidural to C-Section    Regional Anesthesia Start    Regional Anesthesia Stop    Regional Block Administered         Plan:  - f/u in clinic,  immobilization 8-12 weeks pending healing and xrays    Rounds Marcos MD  Plastic and Reconstructive Surgery, PGY-6  [facility]

## 2024-03-31 NOTE — H&P (Signed)
 [facility] Orthopaedic Surgery  Hand Service    Chief Complaint:  Right wrist pain    History of Present Illness:   Randy Erickson is a 28 year old male who presents for right scaphoid nonunion repair with iliac crest bone autograft. Randy Erickson is feeling well and ready to proceed with surgery today with no new complaints. Please see previous clinic note from 02/24/2024 for discussion leading up to decision for surgery.     Past Medical History:  Past Medical History:   Diagnosis Date    COVID-19 vaccination not done     COVID-19 virus infection 04/2021    not hospitalized; denies lingering symptoms/sequelae        Past Surgical History:   Past Surgical History:   Procedure Laterality Date    hand Right 2010        Allergies:  No Known Allergies     Medications:   No current facility-administered medications on file prior to encounter.     Current Outpatient Medications on File Prior to Encounter   Medication Sig Dispense Refill    acetaminophen  (TYLENOL ) 500 MG tablet Take 1-2 tabs every 6 hours as needed. Do not exceed 4000 mg in 24 hours from all sources. (Patient not taking: Reported on 03/27/2024) 90 tablet 0    ibuprofen  (MOTRIN ) 600 MG tablet Take 1 tablet (600 mg) by mouth every 6 hours as needed for Mild Pain (Pain Score 1-3) or Moderate Pain (Pain Score 4-6). (Patient not taking: Reported on 03/27/2024) 60 tablet 1    oxyCODONE  (ROXICODONE ) 5 MG immediate release tablet Take 1 tablet (5 mg) by mouth every 6 hours as needed for Severe Pain (Pain Score 7-10). (Patient not taking: Reported on 03/27/2024) 12 tablet 0        Review of Systems:  A complete review of systems was performed and negative except as in HPI    Vitals:   03/31/24  0713   BP: 135/79   Pulse: 70   Temp: 97.2 F (36.2 C)   Resp: 16   SpO2: 97%        Physical Exam:  General Appearance:  no acute distress  Cardiovascular:  regular rate per peripheral pulses  Pulmonary: non-labored breathing    Focused exam of the  right wrist:  Healed volar wrist incision  Full digital flexion and extension  Neurovascularly intact distally      Assessment and Care Plan:  Randy Erickson is a 28 year old male who presents for right scaphoid nonunion repair with iliac crest bone autograft.    -Plan is to proceed with surgery  -Consent documented electronically in chart  -Post op recommendations to follow in brief op note     This patient was seen and discussed with supervising practitioner of record for this patient care encounter, Dr. Carlon.    Earnest Dauphin, MD  Orthopedic Surgery

## 2024-04-02 ENCOUNTER — Telehealth (HOSPITAL_BASED_OUTPATIENT_CLINIC_OR_DEPARTMENT_OTHER): Payer: Self-pay | Admitting: Hand Surgery

## 2024-04-02 DIAGNOSIS — S62021K Displaced fracture of middle third of navicular [scaphoid] bone of right wrist, subsequent encounter for fracture with nonunion: Secondary | ICD-10-CM

## 2024-04-02 DIAGNOSIS — Z419 Encounter for procedure for purposes other than remedying health state, unspecified: Secondary | ICD-10-CM

## 2024-04-02 NOTE — Telephone Encounter (Signed)
 ALL information is REQUIRED to be filled out prior to routing!    Reason for call: Forms ([phone])    Patient calling to provide online disability information 04/02/2024    Provider:Meunier, Donnice MD    Verify exact spelling of name on EDD paperwork (may differ from Epic name)  so provider can find patient in system -  Randy Erickson     Is the Patient's SSN# on the Registration correct?Yes    On-line disability Receipt number (New disability Request) (example [id number]........)or  [id number]       Date of Injury/Surgery:03/31/2024    Date Patient Expects to be Returning to Work:08/01/2024    Job Title: Curator     Job Functions: Inspecting, Maintaining, repairing vehicles, machinery    Name of Employer:[employer]     Phone Number: [phone]     Fax Number:n/a    Notes:n/a                           (R number is 15 digits, and the DI should have 10 digits)                  Caller was informed that turnaround time for forms is 7-10 business days:Yes    Caller was informed that there is a $10 fee for processing disability forms:Yes    Please inform caller the disability will begin on the date of the surgery

## 2024-04-02 NOTE — Telephone Encounter (Signed)
 RN placed call to pt  No answer.  Im sorry but the person you are trying to reach has a voicemail box that has not been set up yet, plese try your call again later, good-bye       ==========================================================  Reason for call: Pt calling in due to hip pain. Pt had Sx with Dr. Carlon on 03/31/2024 and states he is having excessive hip pain. Pt would like to speak to RN to further discuss anything he should/could be doing to help with this.  Pt states he was prescribed Oxy but is not taking the as regularly because he does not like how he feels.     DOS:03/31/2024 Carlon Randy Rush, MD Right repair fracture, nonunion, scaphoid, with right iliac crest bone autograft   LOV: 02/24/2024  NOV:04/16/2024

## 2024-04-02 NOTE — Op Note (Signed)
 DATE OF SERVICE:  03/31/2024    PREOPERATIVE DIAGNOSIS:  Scaphoid nonunion following prior surgery.    POSTOPERATIVE DIAGNOSIS:  Scaphoid nonunion following prior surgery.    PROCEDURE PERFORMED:    1. Open treatment of scaphoid nonunion.    2. Removal of hardware, right scaphoid.    SURGEON/STAFF:  Donnice Norleen Butters, MD    ASSISTANT:    1. Earnest Ronal Dauphin, MD.  2. Ileana Commander, MD.    ANESTHESIA:  Laryngeal mask airway with infraclavicular block.    COMPLICATIONS:  None.    COMPLEX PROCEDURE:  This is a complex procedure requiring additional dissection, mobilization and exposure due to prior surgical dissection and loose hardware.    SUMMARY OF CASE:  This is a 28 year old male who had previous open  treatment of a scaphoid nonunion roughly 3 years ago.  He presented  again with persistent nonunion and pain over the right wrist  consistent with nonhealing of his previously surgically treated  scaphoid.  CT scan demonstrated alignment was stable.  Options were  discussed with the patient and he wished to proceed with surgical  stabilization of his injury.  The operative site was identified and  signed in the operative holding area.  Consent was confirmed in the  operative holding area.  He was brought into the operating room,  positioned supine on operative table.  Laryngeal mask airway was  placed by the Department of Anesthesia following placement of an  infraclavicular block.  Preoperative time-out was performed and  preoperative antibiotics were given.  Sterile prep and drape was then  performed in usual fashion with ChloraPrep for the arm and left iliac  crest.  A bump had been placed under the iliac crest for  visualization.  Once the prep and drape were completed, preoperative  time-out was performed and preoperative antibiotics were given.  The  arm was then exsanguinated with an Esmarch bandage and tourniquet was  inflated to 250 mmHg.  Following inflation of the tourniquet, the  previous  hockey-stick incision was utilized.  The dissection was  carried down through the soft tissue to expose the scaphotrapezial  joint.  The scaphotrapezial joint was then entered and the previous  screw hole was identified.  The incision was then carried proximally.   The radioscaphoid capitate ligament was again divided through the  previous location.  This exposed the volar surface of the scaphoid.  Once the scaphoid was exposed, the guide pin was placed through the  prior screw and the screw was removed in its entirety without  complications.  The guide pin was left in place for visualization of  the screw placement as on CT scan the prior screw placement was  acceptable and a new screw pathway ran a substantial risk of  fracturing into the previous pathway.  Once this was determined, a 4  cm incision was made over the right iliac crest.  Dissection was  carried down through the soft tissue to expose the raphae between the  gluteus and the abdominal obliques.  Subperiosteal elevation was  performed over the iliac crest and the iliac crest was exposed.  An  oscillating saw was used to provide entry to the outer table of the  iliac crest.  Once the iliac crest was hinged open cancellous bone  was harvested with a large curette from the iliac crest.  Once  adequate bone was harvested, the scaphoid was prepared with curettage  of the nonunion site and removal of all fibrinous debris.  The  scaphoid was then extended and the bone defect was packed with  cancellous bone from the iliac crest.  Compression was achieved with  a tamp.  Once this was confirmed, a 24 mm x 4 mm headless compression  screw was selected as the previous pathway was acceptable.  The screw  was then placed through the previous pathway with good compression of  the fracture site and bone graft.  Once this was confirmed,  fluoroscopy was used to confirm screw placement and scaphoid  alignment.  There was no penetration of the screw proximal  or  distally.  Once this was confirmed, the wrist was irrigated.  The  radioscaphocapitate ligament was closed.  Skin was closed with 3-0  Vicryl and 4-0 Monocryl suture.  The iliac crest was closed with  periosteal closure and closure of the interval between the gluteus  and the oblique muscles.  Skin was closed with 3-0 Vicryl and 4-0  Monocryl suture.  Marcaine  was infiltrated in the wound.  He  tolerated the procedure well and there were no complications.  He was  then placed into a thumb spica splint for his right wrist.  The  tourniquet was deflated and he was transferred to the recovery room  in stable condition.       Job #:  [id number]

## 2024-04-02 NOTE — Telephone Encounter (Signed)
 Name of caller:Randy Erickson, Randy Erickson   Relationship to patient:Self  Interpreter needed:No    Reason for call: Medical Advice     Pt calling in due to hip pain. Pt had Sx with Dr. Carlon on 03/31/2024 and states he is having excessive hip pain. Pt would like to speak to RN to further discuss anything he should/could be doing to help with this.  Pt states he was prescribed Oxy but is not taking the as regularly because he does not like how he feels.     -Please f/u and advise    Best number to be reached at:  [phone]           Route to RN pool if requesting clinical advice, it can take 24-48 hours turnaround time, route to MA if administrative advice it can take 3-5 business days turn around time.     If an action is needed from the call center please route to P Ortho Scheduling Pool (not directly to cc staff)

## 2024-04-07 NOTE — Telephone Encounter (Signed)
 Routed to provider to review refill request and sign if appropriate.  ==========================================================  Reason for call: intermittent right hip pain    DOS: 03/31/2024 Right repair fracture, nonunion, scaphoid, with right iliac crest bone autograft (Dr. Carlon)  NOV: 04/16/2024    Assessment:  Outgoing call to patient. 2 patient identifiers verified.  Pain has improved since surgery. Pain in post-op hand and hip is tolerable except when he applies pressure on his pelvic area (ie. sneezing, coughing, BMs). Feels like he is getting hit with a bat at the hip. 10/10 pain lasts about 5-10 minutes then gets better.   He has to take an oxycodone  to manage it. He doesn't like taking the medication due to drowsiness so only takes PRN. He has 4 tablets left. He also continues to take the tylenol  1000 mg BID.   RN advised to increase the Tylenol  to [phone] mg TID and taper down oxycodone  to 1/2 tablet (2.5 mg) q4h PRN if it is making him too drowsy and the pain is well controlled on the Tylenol . Patient agrees with plan. Request refill to be sent to preferred pharmacy on file.    Also reports his dressing is falling off, the saran wrap. Gauze underneath is clean. No blood or discharge visible.  RN asked patient to send photo via MyChart for reinforcement advice. States he is locked out of his account. He attempted to email photo as well but failed. He will go to urgent care if the dressing comes completely off prior to his 1st post-op visit next week.    Yates POUR, RN  Ambulatory Float Team

## 2024-04-08 ENCOUNTER — Encounter (HOSPITAL_BASED_OUTPATIENT_CLINIC_OR_DEPARTMENT_OTHER): Payer: Self-pay

## 2024-04-08 DIAGNOSIS — M25531 Pain in right wrist: Secondary | ICD-10-CM

## 2024-04-08 NOTE — Telephone Encounter (Signed)
 Per Regan PA: Better to take ibuprofen  600 mg TID for a week.     Notified pt that provider didn't sign Oxycodone  refill.  Advised per APP recommendations, to trial Ibuprofen  600mg  TID x1 week & take medication w/ food to prevent GI upset. Advised to call ortho cc [phone] if his symptoms/pain worsen. Pt v/u to POC.    Closing encounter  Confirmed 2 patient identifiers prior to proceeding with call.

## 2024-04-13 ENCOUNTER — Telehealth (HOSPITAL_BASED_OUTPATIENT_CLINIC_OR_DEPARTMENT_OTHER): Payer: Self-pay | Admitting: Hand Surgery

## 2024-04-13 NOTE — Telephone Encounter (Signed)
 Patient calling to advise receive a text message from EDD who stated we need to completed part B form Medical Certification in order to process his EDD no forms were provided to him to call him if any questions  his [phone].

## 2024-04-14 ENCOUNTER — Encounter (HOSPITAL_BASED_OUTPATIENT_CLINIC_OR_DEPARTMENT_OTHER): Payer: Self-pay | Admitting: Hospital

## 2024-04-14 NOTE — Telephone Encounter (Signed)
 EDD submitted online ... RTW: 09.27.2025 per Regan.     Patient informed via [facility].

## 2024-04-15 ENCOUNTER — Encounter (HOSPITAL_BASED_OUTPATIENT_CLINIC_OR_DEPARTMENT_OTHER): Payer: Self-pay | Admitting: Hand Surgery

## 2024-04-15 MED ORDER — MIDAZOLAM HCL 2 MG/2ML IJ SOLN
INTRAMUSCULAR | Status: DC | PRN
Start: 2024-03-31 — End: 2024-04-15
  Administered 2024-03-31: 2 mg via INTRAVENOUS

## 2024-04-15 NOTE — Addendum Note (Signed)
 Addendum  created 04/15/24 0927 by Cristi Clap, MD    Intraprocedure Event edited, Intraprocedure Meds edited, Intraprocedure Staff edited

## 2024-04-15 NOTE — Telephone Encounter (Signed)
 Matter resolved. Please refer to [facility].

## 2024-04-16 ENCOUNTER — Ambulatory Visit (HOSPITAL_BASED_OUTPATIENT_CLINIC_OR_DEPARTMENT_OTHER): Admit: 2024-04-16 | Discharge: 2024-04-16 | Disposition: A | Discharge status: Home Health

## 2024-04-16 ENCOUNTER — Ambulatory Visit: Attending: Hand Surgery

## 2024-04-16 DIAGNOSIS — S62021K Displaced fracture of middle third of navicular [scaphoid] bone of right wrist, subsequent encounter for fracture with nonunion: Secondary | ICD-10-CM | POA: Insufficient documentation

## 2024-04-16 DIAGNOSIS — S62001K Unspecified fracture of navicular [scaphoid] bone of right wrist, subsequent encounter for fracture with nonunion: Secondary | ICD-10-CM

## 2024-04-16 DIAGNOSIS — Z09 Encounter for follow-up examination after completed treatment for conditions other than malignant neoplasm: Secondary | ICD-10-CM | POA: Insufficient documentation

## 2024-04-16 DIAGNOSIS — M25531 Pain in right wrist: Secondary | ICD-10-CM | POA: Insufficient documentation

## 2024-04-16 NOTE — Patient Instructions (Signed)
 Hello, my name is Clovis Fredrickson I am the Medical Assistant who helped you out today.     It was a pleasure assisting you today. If you have any questions please feel free to call us at [phone].     We are committed to making sure our patient receive the best care. Satisfaction surveys are now randomly being sent out to patients after their visits. These surveys will be received via text, e-mail and or in the mail.  If you happen to get one, we'd greatly appreciate you taking a few minutes to fill it out to let us know what you like best about your visit and if there is anything we can improve. We use this information to be sure you are receiving the best care, as well as to recognize staff members who do a great job.    Thank you for using [facility] for your healthcare needs. Have a great day!

## 2024-04-16 NOTE — Interdisciplinary (Signed)
 Procedure: Short Arm Cast Applied: TYPE: Thumb spica  IP Free. Patient, family member, and/or caregiver understand the use and application of this item. They are aware that should the cast/brace result in increased pain, decreased sensation, increased swelling, or an overall worsening of their medical condition, to please contact our office immediately.      Location: Right Upper Extremity  Instruction/Education Provided: yes    Ordering Physician: Araceli Locus MD    Breg Product: No    Cast applied by [employer] Student Newell

## 2024-04-16 NOTE — Progress Notes (Signed)
 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 Reports compliance with nonweightbearing restrictions and splint wear. Denies interval trauma. No fevers or chills. Pain well controlled.     PHYSICAL EXAMINATION  VITALS: There were no vitals taken for this visit., There is no height or weight on file to calculate BMI., Pain Score: 4/10  GENERAL: A+Ox3.  Well appearing and well groomed.   MENTAL STATUS: Pleasant and cooperative.  UPPER EXTREMITY:   Focused examination of the right wrist out of splint demonstrates well healing incision without erythema or signs of infection.   Fires EPL, FPL, interossei.  Sensation intact in median, radial, and ulnar nerve distributions  2+ radial pulse    Right hip incision well healing without erythema, drainage or signs of infection.    IMAGING STUDIES  Radiographs of the right wrist obtained today in clinic demonstrate revision scaphoid nonunion fixation with bone grafting. Unchanged alignment from intraoperative fluoroscopic images from 03/31/2024.    IMPRESSION AND PLAN  [name] 2 weeks s/p revision right scaphoid nonunion repair with iliac crest autograft progressing appropriately. He will be transitioned to a thumb spica cast today. A DME order has been placed for an ultrasound bone stimulator to promote healing given second attempt at scaphoid nonunion repair. Discussed continued strict NWB restrictions. We will have him follow up with Dr. Carlon in 6 weeks. All questions were answered and he was in agreement with this plan.    Discussed with Dr. Araceli.    Walton Digilio M. Shawn, MD  Orthopaedic Surgery Resident

## 2024-04-17 NOTE — Progress Notes (Signed)
 See residents note for details.  The patient was seen and evaluated.  I have discussed the patient with the resident and agree with residents findings and the plan we developed as written.

## 2024-05-16 ENCOUNTER — Encounter (HOSPITAL_BASED_OUTPATIENT_CLINIC_OR_DEPARTMENT_OTHER): Payer: Self-pay

## 2024-05-20 ENCOUNTER — Encounter (HOSPITAL_BASED_OUTPATIENT_CLINIC_OR_DEPARTMENT_OTHER): Payer: Self-pay

## 2024-05-27 ENCOUNTER — Encounter (HOSPITAL_BASED_OUTPATIENT_CLINIC_OR_DEPARTMENT_OTHER): Payer: Self-pay | Admitting: Hand Surgery

## 2024-05-27 DIAGNOSIS — M25531 Pain in right wrist: Secondary | ICD-10-CM

## 2024-05-29 ENCOUNTER — Ambulatory Visit (HOSPITAL_BASED_OUTPATIENT_CLINIC_OR_DEPARTMENT_OTHER): Admit: 2024-05-29 | Discharge: 2024-05-29 | Disposition: A | Discharge status: Home Health

## 2024-05-29 ENCOUNTER — Ambulatory Visit: Attending: Hand Surgery | Admitting: Hand Surgery

## 2024-05-29 ENCOUNTER — Encounter (HOSPITAL_BASED_OUTPATIENT_CLINIC_OR_DEPARTMENT_OTHER): Payer: Self-pay | Admitting: Hand Surgery

## 2024-05-29 VITALS — Ht 74.0 in | Wt 195.0 lb

## 2024-05-29 DIAGNOSIS — Z09 Encounter for follow-up examination after completed treatment for conditions other than malignant neoplasm: Secondary | ICD-10-CM

## 2024-05-29 DIAGNOSIS — M25531 Pain in right wrist: Secondary | ICD-10-CM | POA: Insufficient documentation

## 2024-05-29 DIAGNOSIS — S62021K Displaced fracture of middle third of navicular [scaphoid] bone of right wrist, subsequent encounter for fracture with nonunion: Secondary | ICD-10-CM | POA: Insufficient documentation

## 2024-05-29 NOTE — Progress Notes (Addendum)
 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  Patient was last seen in clinic on 06/12, he was discharged when a thumb spica cast, however, in the interim the cast was removed apparently to allow use of an ultrasound bone stimulator.  Reports he has been compliant with the nonweightbearing status and has been utilizing his removable brace as instructed.       Physical Exam:  Exam is focused on the right hand.  He has a well-healed surgical incision on the volar wrist crease with no evidence of infection.  Fires EPL/FPL.  Some subjective peri-incisional numbness. Patient is distally neurovascularly intact in all distributions.     Reviewed right hand xray with demonstration of attempted healing of the scaphoid waist. Screw in place with no migration.     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  Counseled patient on the importance of maintaining his cast as the healing process is tenuous in this region and immobility is important.  Thumb spica cast was reapplied with electrodes for bone stimulator in place.  Patient will return to clinic in 4 weeks for repeat evaluation.   All questions answered, patient comfortable w/ plan.     Patient seen and discussed with Dr. Carlon Elston CHARLENA Carolee, M.D.  Plastic and Reconstructive Resident  [facility]

## 2024-05-29 NOTE — Patient Instructions (Signed)
 Exogen Bone Stimulator:    Marjorie Luis: [phone]

## 2024-06-19 ENCOUNTER — Encounter (HOSPITAL_BASED_OUTPATIENT_CLINIC_OR_DEPARTMENT_OTHER): Payer: Self-pay | Admitting: Hand Surgery

## 2024-06-19 DIAGNOSIS — M25531 Pain in right wrist: Secondary | ICD-10-CM

## 2024-06-29 ENCOUNTER — Ambulatory Visit (HOSPITAL_BASED_OUTPATIENT_CLINIC_OR_DEPARTMENT_OTHER): Admit: 2024-06-29 | Discharge: 2024-06-29 | Disposition: A | Discharge status: Home Health

## 2024-06-29 ENCOUNTER — Ambulatory Visit (HOSPITAL_BASED_OUTPATIENT_CLINIC_OR_DEPARTMENT_OTHER): Admitting: Hand Surgery

## 2024-06-29 ENCOUNTER — Ambulatory Visit (HOSPITAL_BASED_OUTPATIENT_CLINIC_OR_DEPARTMENT_OTHER): Admit: 2024-06-29

## 2024-06-29 DIAGNOSIS — M25531 Pain in right wrist: Secondary | ICD-10-CM | POA: Insufficient documentation

## 2024-06-29 DIAGNOSIS — Z09 Encounter for follow-up examination after completed treatment for conditions other than malignant neoplasm: Secondary | ICD-10-CM

## 2024-06-29 DIAGNOSIS — S62021K Displaced fracture of middle third of navicular [scaphoid] bone of right wrist, subsequent encounter for fracture with nonunion: Secondary | ICD-10-CM

## 2024-06-29 NOTE — Progress Notes (Signed)
 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 Post-operative course was complicated by removal of his thumb spica cast to permit use of an ultrasound bone simulator. Thumb spica was then reapplied on 05/29/2024. Patient stated that on 8/16, he removed his thumb spica cast to fit in a suit for a his girlfriend's funeral. He has been in a velcro wrist brace since. He reports being complaint with NWB status. He denies any numbness/tingling. He has no pain at rest.      Physical Exam:  Incision c/d/I. Non-tender to palpation about the wrist. Fires EPL/FPL/IO. SILT m/u/r. Hand wwp.   Patient is distally neurovascularly intact in all distributions.     Imaging: XR of the right wrist demonstrate evidence of screw fixation of a scaphoid waist fracture. There is marginal interval healing compared to lateral XR in July. No hardware complications noted.      A/P:  55 RHD M s/p revision open treatment of R scaphoid nonunion with iliac crest autograft on 03/31/2024. Has had incomplete compliance with with thumb spica cast immobilization.  At this time, it is reasonable to assess for evidence of bony healing on computed tomography. The patient may use a Velcro wrist brace to be worn at all times except when showering.  He should remain nonweightbearing on his right upper extremity with restrictions that include but are not limited to no heavy lifting, gripping, or torquing.  He will follow up with us  after his CT is performed. Patient voiced their understanding and was amenable to the plan moving forward. All questions were answered    Patient seen and discussed with Dr. Carlon Lonni EMERSON Arminda, MD  [facility]

## 2024-06-30 ENCOUNTER — Telehealth (HOSPITAL_BASED_OUTPATIENT_CLINIC_OR_DEPARTMENT_OTHER): Payer: Self-pay | Admitting: Hand Surgery

## 2024-06-30 ENCOUNTER — Ambulatory Visit
Admission: RE | Admit: 2024-06-30 | Discharge: 2024-06-30 | Disposition: A | Discharge status: Home Health | Attending: Hand Surgery | Admitting: Hand Surgery

## 2024-06-30 ENCOUNTER — Ambulatory Visit (HOSPITAL_BASED_OUTPATIENT_CLINIC_OR_DEPARTMENT_OTHER): Admit: 2024-06-30 | Discharge: 2024-06-30 | Disposition: A | Discharge status: Home Health

## 2024-06-30 DIAGNOSIS — S62021K Displaced fracture of middle third of navicular [scaphoid] bone of right wrist, subsequent encounter for fracture with nonunion: Secondary | ICD-10-CM | POA: Insufficient documentation

## 2024-06-30 NOTE — Telephone Encounter (Signed)
 Name of caller: Carlin   Relationship to patient:Self  Interpreter needed:No    Reason for call: Other; please add reason of call  Patient had a CT scan done and would like to see if Dr. Carlon can call him with results. Patient is schedule for a follow up on 09/15. Please assist.         LOV Date & Plan:  A/P:  73 RHD M s/p revision open treatment of R scaphoid nonunion with iliac crest autograft on 03/31/2024. Has had incomplete compliance with with thumb spica cast immobilization.  At this time, it is reasonable to assess for evidence of bony healing on computed tomography. The patient may use a Velcro wrist brace to be worn at all times except when showering.  He should remain nonweightbearing on his right upper extremity with restrictions that include but are not limited to no heavy lifting, gripping, or torquing.  He will follow up with us  after his CT is performed. Patient voiced their understanding and was amenable to the plan moving forward. All questions were answered     Patient seen and discussed with Dr. Carlon Lonni EMERSON Arminda, MD  [facility]           Best number to be reached at:  [phone]          Route to RN pool if requesting clinical advice, it can take 24-48 hours turnaround time, route to MA if administrative advice it can take 3-5 business days turn around time.     If an action is needed from the call center please route to P Ortho Scheduling Pool (not directly to cc staff)

## 2024-07-01 DIAGNOSIS — Z01818 Encounter for other preprocedural examination: Secondary | ICD-10-CM

## 2024-07-01 DIAGNOSIS — S62021K Displaced fracture of middle third of navicular [scaphoid] bone of right wrist, subsequent encounter for fracture with nonunion: Secondary | ICD-10-CM

## 2024-07-02 NOTE — Telephone Encounter (Signed)
 Called patient, no answer, unable to LVM    Gerlene Burdock,   [facility] RN Float Team

## 2024-07-13 ENCOUNTER — Ambulatory Visit: Attending: Hand Surgery | Admitting: Hand Surgery

## 2024-07-13 DIAGNOSIS — S62021K Displaced fracture of middle third of navicular [scaphoid] bone of right wrist, subsequent encounter for fracture with nonunion: Secondary | ICD-10-CM

## 2024-07-13 NOTE — Patient Instructions (Signed)
 Please call [facility] to schedule your surgery at your earliest convenience.     Lestine Mount  Surgery Coordinator  [phone]

## 2024-07-13 NOTE — Progress Notes (Signed)
 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 Post-operative course was complicated by removal of his thumb spica cast on two separate occasions.  At last visit we converted him to a Velcro wrist brace. He reports ongoing significant wrist pain and he is unable to use his wrist normally. He reports he is unable to hold his phone without pain. Patient presents to review CT results.     Physical Exam:  General: NAD  Neuro: Alert, oriented, appropriate mood and affect to situation  Cardiovascular: extremities warm, well perfused  Pulmonary: breathing unlabored, symmetric chest expansion  Ambulation: within normal limits     RUE:  Incision c/d/I.   Non-tender to palpation about the wrist.   Fires EPL/FPL/IO.   SILT m/u/r.   Radial pulse 2+  Cap refill <2 seconds    Imaging:   CT of the right upper extremity w/o contrast performed on 06/30/24 revealed postoperative changes of scaphoid nonunion repair. Mild lucency surrounding fixation screw, concerning for loosening. Fracture remains ununited. Ischemic changes again noted within the proximal pole with possible cut out of the screw tip at the proximal articular surface.     Impression:   Revision open treatment of R scaphoid nonunion with iliac crest autograft (03/31/2024)    Assessment and Plan:  27 RHD M s/p revision open treatment of R scaphoid nonunion with iliac crest autograft on 03/31/2024. CT scan was reviewed in detail with patient in office today. Patient reports persistent, significant pain. Discussion was held regarding the possibility of another surgical procedure. Options for revision surgery were discussed with the patient, including risks and benefits. Patient to consider surgical intervention given persistent pain and nonunion. Continue pain control and activity modification. We discussed work limitations and  expected return to work time following the surgery will be 4 months. He was recommended to call my surgery scheduler.    Seen and examined with Glori Allis, MS4, [place]  [employer].  The history, physical exam, pertinent findings and treatment plan were formulated by myself and discussed with the medical student and patient.

## 2024-07-14 ENCOUNTER — Telehealth (HOSPITAL_BASED_OUTPATIENT_CLINIC_OR_DEPARTMENT_OTHER): Payer: Self-pay | Admitting: Hand Surgery

## 2024-07-14 NOTE — Telephone Encounter (Addendum)
 9/17 @ 1245pm s/w pt and scheduled surgery and all associated appts and sent letter via MyChart    9/17 r/c to patient to schedule surgery however his vm is not set up. Will send MyChart message offering 9/30    9/9 r/c to patient to let him know I don't have all necessary pw from his office visit yesterday to start the surgery scheduling process. Message sent to provider and when I have everything, I will call to schedule.Pt verbalized understanding.

## 2024-07-20 ENCOUNTER — Ambulatory Visit: Admitting: Hand Surgery

## 2024-07-22 ENCOUNTER — Encounter (HOSPITAL_BASED_OUTPATIENT_CLINIC_OR_DEPARTMENT_OTHER): Payer: Self-pay | Admitting: Hand Surgery

## 2024-07-22 ENCOUNTER — Encounter (HOSPITAL_BASED_OUTPATIENT_CLINIC_OR_DEPARTMENT_OTHER): Payer: Self-pay | Admitting: Hospital

## 2024-07-30 ENCOUNTER — Ambulatory Visit

## 2024-07-30 ENCOUNTER — Encounter (HOSPITAL_BASED_OUTPATIENT_CLINIC_OR_DEPARTMENT_OTHER): Payer: Self-pay

## 2024-07-30 DIAGNOSIS — Z01818 Encounter for other preprocedural examination: Secondary | ICD-10-CM

## 2024-07-30 NOTE — Patient Instructions (Signed)
 Pre-procedure Anesthesia Instructions    Your procedure is currently scheduled at [facility] on 9/30  The procedure team's scheduler will be contacting you with the check in time                                                                                 Facility Address    [facility], [address] [place], [place] [address]  Check in and Operating room is down the elevator on the lower level (LL)    Parking    [facility]: [employer], Adult nurse structure, or Scientist, water quality parking (7am-5pm at [employer] entrance and [employer] engineer; 5am-5pm at [employer] by Emergency room/Labor and Delivery) for same cost as self-parking.  [email or web address]       Questions    If you have any questions between now and the day of your surgery, please do not hesitate to call: Anesthesia Preparedness Clinic: [phone]       Day of Procedure Arrival Time:    On the day of your procedure, please arrive at the time provided by the procedure team's office.   If you have any questions regarding your arrival time, please call the procedure team, anesthesia cannot provide arrival times.     Preoperative Surgical Admissions at [facility] [phone]  [facility]'s current visiting hours and visitor information can be found here (please note, some areas do limit visitors under the age of 41):  [email or web address]    Medication Instructions     Medications to hold prior to procedure: see below    PLEASE HOLD ALL NSAIDS (non-steroidal anti-inflammatory drugs) SUCH AS advil , aleve, motrin , ibuprofen , relafen, lodine, feldene, Diclofenac, voltaren, indomethacin, naproxen, celebrex, Mobic 7 days before surgery.     Please do not take products that contain aspirin including Excedrin, Bufferin, Bayer, Alka-Seltzer, Asacol, Fiorinal, Pepto-Bismol (these are just the most common, please check  labels to be sure) for 7 days prior to procedure.       Please hold vitamins, supplements, CO-Q10, Glucosamine, herbs & fish oil 7 days before surgery. Vitamin C, D, magnesium and calcium are ok to take one week prior, please don't take these on the day of the procedure.    It is OK to take acetaminophen  (Tylenol ) for pain around the time of procedure unless you have liver disease.      AFTER YOUR VISIT WITH US , IF YOU START TAKING A NEW MEDICATION BEFORE PROCEDURE, PLEASE CALL US  TO MAKE SURE IT IS SAFE TO TAKE & WILL NOT AFFECT YOUR PROCEDURE.       OSA Instructions     If you use a CPAP machine, please bring the entire machine, including mask and tubing, with you on the day of surgery.    To-do list       Eating/drinking       No solid food after midnight on the day of procedure  OK?to have water, black coffee (NO cream), apple juice, or cranberry juice up to 3 hours prior to procedure start time       Please only choose drinks from this list. Everything else is NOT allowed  Preparing for your Procedure:     -Please wear clean loose-fitting clothes and leave valuables at home.  -Do not shave or remove body hair. Facial shaving is permitted. If you are having head surgery, ask your surgeon whether you can shave.  -Bring a picture ID and your insurance card, and be prepared to pay your deductible or co-insurance by cash, check, or credit card when you arrive.   -If you are going home after your procedure, please make sure to arrange for an adult that is known to you to drive you home. You CANNOT use UBER or LYFT. If you do not have a ride, your procedure may be cancelled.    -Visitor policy during the COVID-19 pandemic is subject to change. Current visitor policy can be found at [email or web address]  -If you are on home oxygen and have a portable oxygen tank, bring it with you on the day of the surgery to use when you are discharged to go home.  -Please abstain from  recreational drugs, smoking and alcohol consumption in the time period leading up to your procedure.  These substances can impact your procedure and recovery.  -If you develop a fever, cold, flu or COVID before the day of procedure please contact the procedure team or anesthesia to discuss.  Your procedure could be cancelled on the day if you arrive with current symptoms     On The Day of Your Surgery:      -Check in at the location mentioned above   -COVID testing may be done on arrival to the Pre-op or Procedural areas according to the current [facility] mandate.   -If you are a woman of child bearing age, please note that you may be asked to give a urine sample upon check-in  -You will meet your anesthesia and surgery teams in the preoperative holding area before surgery.   -Once surgery is over, you will wake up in the recovery room or appropriate inpatient unit.  -If you go home, an adult chaperone will need to stay with you for the first 24 hours after surgery.   -A video about what to expect for the day of surgery can be found here:    [email or web address]   Or by searching "You-tube" for [place] before surgery and [place] after surgery  -Your medical records are available to you at [email or web address]

## 2024-07-30 NOTE — Interdisciplinary (Signed)
Anesthesia Preparedness Clinic [facility]) RN PHONE CALL NOTE    Phone call to patient from [facility] RN today.     Confirmed medical history & that medications listed in Epic are accurate and up to date. See pre-eval notes.    Patient scheduled for surgery on 9/30 at [facility].    Confirmed preoperative instructions with patient including NPO instructions.     Planning your surgery information sent to patient via mychart.    No questions or concerns at this time.

## 2024-07-30 NOTE — Anesthesia Preprocedure Evaluation (Addendum)
 ANESTHESIA PRE-OPERATIVE EVALUATION    Patient Information    Name: Randy Erickson    MRN: [id number]    DOB: [birth year]    Age: 28 year old    Sex: male  Procedure(s):  Right repair fracture, nonunion, scaphoid, possible bone graft      Pre-op Vitals:   There were no vitals taken for this visit.        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 Post-operative course was complicated by removal of his thumb spica cast on two separate occasions. Converted him to a Velcro wrist brace. He reports ongoing significant wrist pain and he is unable to use his wrist normally.    S/f right repair fracture, nonunion, scaphoid, possible bone graft    General:  negative for General ROS  able to climb flight of stairs/Exercise tolerance >4 mets,   Cardiovascular:  Denies any SOB, CP, palpitations, LE swelling, dizziness or syncope.     Anesthesia History:  negative anesthesia history ROS  no history of anesthetic complications,   Pulmonary:   no asthma,  no sleep apnea,  no recent URI/pneumonia,     Neuro/Psych:   negative for TIA/CVA,  no seizures,   Hematology/Oncology:   hematologic/lymphatic negative      GI/Hepatic:  negative GI/hepatic ROS Infectious Disease:  negative for infectious disease     Renal:  negative renal ROS   Endocrine/Other:  no diabetes,   no history of thyroid disease,      Pregnancy History:   Pediatrics:         Pre Anesthesia Testing (PCC/CPC) notes/comments:    [facility] Test & records reviewed by [facility] Provider.                                        Physical Exam    Airway:     Inter-inciser distance > 4 cm  Prognanth Able    Mallampati: I  Neck ROM: full  TM distance: > 6 cm  Short thick neck: No          Cardiovascular:  - cardiovascular exam normal             Pulmonary:  - pulmonary exam normal               Neuro/Neck/Skeletal/Skin:      Dental:      Abdominal:              Last OSA (STOP BANG) Score:   Has a physician diagnosed you with sleep apnea?: No  Do you use a CPAP at home?: No  Do you snore loudly (loud enough to be heard through a closed door)?: 0  Do you often feel tired, fatigued or sleepy during the day?: 0  Has anyone observed that you stop breathing while you are sleeping?: 0  Have you ever been treated for high blood pressure?: 0  OSA total score (A score of 2 or more is high risk. Offer patient sleep study.): 0      Past Medical History[1]  Past Surgical History[2]  none     Socioeconomic History    Marital status: Single   Tobacco Use    Smoking status: Never  Smokeless tobacco: Never   Vaping Use    Vaping status: Every Day    Substances: Nicotine   Substance and Sexual Activity    Alcohol use: Not Currently    Drug use: Never     Alcohol Use: Not At Risk (07/30/2024)    AUDIT-C     Frequency of Alcohol Consumption: Never     Average Number of Drinks: Not on file     Frequency of Binge Drinking: Not on file       Current Outpatient Medications   Medication Sig Dispense Refill    acetaminophen  (TYLENOL ) 500 MG tablet Take 1-2 tabs every 6 hours as needed. Do not exceed 4000 mg in 24 hours from all sources. (Patient not taking: Reported on 07/13/2024) 100 tablet 0    docusate sodium  (COLACE) 250 MG capsule Take 1 capsule (250 mg) by mouth 2 times daily. (Patient not taking: Reported on 07/13/2024) 60 capsule 0     Allergies[3]    Labs and Other Data  No results found for: NA, SODIUM, K, CL, BICARB, BUN, CREAT, GLU, CA  No results found for: AST, ALT, GGT, LDH, ALK, TP, ALB, TBILI, DBILI  No results found for: WBC, RBC, HGB, HCT, MCV, MCHC, RDW, PLT, PLCTEL, MPV, MPVH, SEG, LYMPHS, MONOS, EOS, BASOS  No results found for: INR, PTT  No results found for: ARTPH, ARTPO2, ARTPCO2    Anesthesia Plan:  Risks and Benefits of  Anesthesia  I have personally performed an appropriate pre-anesthesia physical exam of the patient (including heart, lungs, and airway) prior to the anesthetic and reviewed the pertinent medical history, drug and allergy history, laboratory and imaging studies and consultations.   I have determined that the patient has had adequate assessment and testing.  I have validated the documentation of these elements of the patient exam and/or have made necessary changes to reflect my own observations during my pre-anesthesia exam.  Anesthetic techniques, invasive monitors, anesthetic drugs for induction, maintenance and post-operative analgesia, risks and alternatives have been explained to the patient and/or patient's representatives.    I have prescribed the anesthetic plan:         Planned anesthesia method: General         ASA 1 (Healthy)     Potential anesthesia problems identified and risks including but not limited to the following were discussed with patient and/or patient's representative: Adverse or allergic drug reaction and Dental injury or sore throat    No Beta Blocker Indicated: Does not meet criteria    Planned monitoring method: Routine monitoring    Informed Consent:  Anesthetic plan and risks discussed with Patient.                   [1]   Past Medical History:  Diagnosis Date    COVID-19 vaccination not done     COVID-19 virus infection 04/2021    not hospitalized; denies lingering symptoms/sequelae   [2]   Past Surgical History:  Procedure Laterality Date    hand Right 2010   [3] No Known Allergies

## 2024-08-03 ENCOUNTER — Other Ambulatory Visit (HOSPITAL_BASED_OUTPATIENT_CLINIC_OR_DEPARTMENT_OTHER): Payer: Self-pay | Admitting: Hand Surgery

## 2024-08-03 DIAGNOSIS — Z419 Encounter for procedure for purposes other than remedying health state, unspecified: Secondary | ICD-10-CM

## 2024-08-04 ENCOUNTER — Ambulatory Visit

## 2024-08-04 ENCOUNTER — Ambulatory Visit (HOSPITAL_BASED_OUTPATIENT_CLINIC_OR_DEPARTMENT_OTHER): Payer: Self-pay

## 2024-08-04 ENCOUNTER — Encounter (HOSPITAL_BASED_OUTPATIENT_CLINIC_OR_DEPARTMENT_OTHER): Payer: Self-pay

## 2024-08-04 ENCOUNTER — Ambulatory Visit
Admission: RE | Admit: 2024-08-04 | Discharge: 2024-08-04 | Disposition: A | Discharge status: Home / Self Care | Payer: Self-pay | Attending: Hand Surgery | Admitting: Hand Surgery

## 2024-08-04 ENCOUNTER — Encounter (HOSPITAL_BASED_OUTPATIENT_CLINIC_OR_DEPARTMENT_OTHER): Admission: RE | Disposition: A | Discharge status: Home Health | Payer: Self-pay | Attending: Hand Surgery

## 2024-08-04 DIAGNOSIS — S62001K Unspecified fracture of navicular [scaphoid] bone of right wrist, subsequent encounter for fracture with nonunion: Secondary | ICD-10-CM | POA: Insufficient documentation

## 2024-08-04 DIAGNOSIS — Z472 Encounter for removal of internal fixation device: Secondary | ICD-10-CM

## 2024-08-04 DIAGNOSIS — F17290 Nicotine dependence, other tobacco product, uncomplicated: Secondary | ICD-10-CM | POA: Insufficient documentation

## 2024-08-04 DIAGNOSIS — S62021K Displaced fracture of middle third of navicular [scaphoid] bone of right wrist, subsequent encounter for fracture with nonunion: Secondary | ICD-10-CM

## 2024-08-04 DIAGNOSIS — Z8616 Personal history of COVID-19: Secondary | ICD-10-CM | POA: Insufficient documentation

## 2024-08-04 DIAGNOSIS — M1990 Unspecified osteoarthritis, unspecified site: Secondary | ICD-10-CM | POA: Insufficient documentation

## 2024-08-04 DIAGNOSIS — Z419 Encounter for procedure for purposes other than remedying health state, unspecified: Secondary | ICD-10-CM

## 2024-08-04 SURGERY — REPAIR, FRACTURE NONUNION, SCAPHOID BONE
Anesthesia: General | Site: Wrist | Laterality: Right | Wound class: Class I (Clean)

## 2024-08-04 MED ORDER — ACETAMINOPHEN 500 MG OR TABS
500.0000 mg | ORAL_TABLET | Freq: Four times a day (QID) | ORAL | 0 refills | Status: AC | PRN
Start: 2024-08-04 — End: 2025-08-04

## 2024-08-04 MED ORDER — FENTANYL CITRATE (PF) 100 MCG/2ML IJ SOLN
INTRAMUSCULAR | Status: AC
Start: 2024-08-04 — End: 2024-08-04
  Filled 2024-08-04: qty 2

## 2024-08-04 MED ORDER — LACTATED RINGERS IV SOLN
INTRAVENOUS | Status: DC | PRN
Start: 2024-08-04 — End: 2024-08-04

## 2024-08-04 MED ORDER — BACITRACIN 500 UNIT/GM EX OINT (CUSTOM)
TOPICAL_OINTMENT | CUTANEOUS | Status: DC | PRN
Start: 2024-08-04 — End: 2024-08-04
  Administered 2024-08-04: 1 via TOPICAL

## 2024-08-04 MED ORDER — OXYCODONE HCL 5 MG OR TABS: 5.0000 mg | ORAL_TABLET | Freq: Four times a day (QID) | ORAL | 0 refills | Status: AC | PRN

## 2024-08-04 MED ORDER — FENTANYL CITRATE (PF) 50 MCG/ML IJ SOLN (WRAPPED RECORD) ~~LOC~~
25.0000 ug | INTRAMUSCULAR | Status: DC | PRN
Start: 2024-08-04 — End: 2024-08-04

## 2024-08-04 MED ORDER — DEXAMETHASONE SODIUM PHOSPHATE 4 MG/ML IJ SOLN (CUSTOM)
INTRAMUSCULAR | Status: DC | PRN
Start: 2024-08-04 — End: 2024-08-04
  Administered 2024-08-04: 6 mg via INTRAVENOUS

## 2024-08-04 MED ORDER — ONDANSETRON HCL 4 MG/2ML IV SOLN
INTRAMUSCULAR | Status: DC | PRN
Start: 2024-08-04 — End: 2024-08-04
  Administered 2024-08-04: 4 mg via INTRAVENOUS

## 2024-08-04 MED ORDER — LIDOCAINE INFUSION FOR PAIN
INTRAVENOUS | Status: DC | PRN
Start: 2024-08-04 — End: 2024-08-04
  Administered 2024-08-04: 30 mg via INTRAVENOUS

## 2024-08-04 MED ORDER — LACTATED RINGERS IV SOLN
INTRAVENOUS | Status: DC
Start: 2024-08-04 — End: 2024-08-04

## 2024-08-04 MED ORDER — FENTANYL CITRATE (PF) 250 MCG/5ML IJ SOLN
INTRAMUSCULAR | Status: DC | PRN
Start: 2024-08-04 — End: 2024-08-04
  Administered 2024-08-04 (×3): 100 ug via INTRAVENOUS

## 2024-08-04 MED ORDER — OXYCODONE HCL 5 MG OR TABS
5.0000 mg | ORAL_TABLET | Freq: Once | ORAL | Status: DC | PRN
Start: 2024-08-04 — End: 2024-08-04

## 2024-08-04 MED ORDER — GLYCOPYRROLATE 1 MG/5ML IJ SOLN
INTRAMUSCULAR | Status: DC | PRN
Start: 2024-08-04 — End: 2024-08-04
  Administered 2024-08-04: .2 mg via INTRAVENOUS

## 2024-08-04 MED ORDER — FENTANYL CITRATE (PF) 50 MCG/ML IJ SOLN (WRAPPED RECORD) ~~LOC~~
50.0000 ug | INTRAMUSCULAR | Status: DC | PRN
Start: 2024-08-04 — End: 2024-08-04

## 2024-08-04 MED ORDER — CEFAZOLIN SODIUM 1 GM IJ SOLR
INTRAMUSCULAR | Status: DC | PRN
Start: 2024-08-04 — End: 2024-08-04
  Administered 2024-08-04: 2000 mg via INTRAVENOUS

## 2024-08-04 MED ORDER — ONDANSETRON HCL 4 MG/2ML IV SOLN
4.0000 mg | Freq: Once | INTRAMUSCULAR | Status: DC | PRN
Start: 2024-08-04 — End: 2024-08-04

## 2024-08-04 MED ORDER — TRAMADOL HCL 50 MG OR TABS
50.0000 mg | ORAL_TABLET | Freq: Once | ORAL | Status: DC
Start: 2024-08-04 — End: 2024-08-04

## 2024-08-04 MED ORDER — ACETAMINOPHEN 325 MG PO TABS
975.0000 mg | ORAL_TABLET | Freq: Once | ORAL | Status: AC
Start: 2024-08-04 — End: 2024-08-04
  Administered 2024-08-04: 975 mg via ORAL
  Filled 2024-08-04: qty 3

## 2024-08-04 MED ORDER — NALOXONE HCL 0.4 MG/ML IJ SOLN
0.1000 mg | INTRAMUSCULAR | Status: DC | PRN
Start: 2024-08-04 — End: 2024-08-04

## 2024-08-04 MED ORDER — AMISULPRIDE (ANTIEMETIC) 10 MG/4ML IV SOLN
10.0000 mg | Freq: Once | INTRAVENOUS | Status: DC
Start: 2024-08-04 — End: 2024-08-04

## 2024-08-04 MED ORDER — MORPHINE SULFATE 4 MG/ML IJ SOLN
2.0000 mg | INTRAMUSCULAR | Status: DC | PRN
Start: 2024-08-04 — End: 2024-08-04
  Administered 2024-08-04: 2 mg via INTRAVENOUS
  Filled 2024-08-04: qty 1

## 2024-08-04 MED ORDER — BUPIVACAINE HCL (PF) 0.25 % IJ SOLN
INTRAMUSCULAR | Status: DC | PRN
Start: 2024-08-04 — End: 2024-08-04
  Administered 2024-08-04: 10 mL

## 2024-08-04 MED ORDER — HYDROMORPHONE HCL 1 MG/ML IJ SOLN
0.5000 mg | INTRAMUSCULAR | Status: DC | PRN
Start: 2024-08-04 — End: 2024-08-04

## 2024-08-04 MED ORDER — MENTHOL MT LZG (WRAPPER)
1.0000 | LOZENGE | Freq: Once | OROMUCOSAL | Status: DC | PRN
Start: 2024-08-04 — End: 2024-08-04

## 2024-08-04 MED ORDER — BUPIVACAINE HCL (PF) 0.25 % IJ SOLN
INTRAMUSCULAR | Status: AC
Start: 2024-08-04 — End: 2024-08-04
  Filled 2024-08-04: qty 30

## 2024-08-04 MED ORDER — BACITRACIN ZINC 500 UNIT/GM EX OINT
TOPICAL_OINTMENT | CUTANEOUS | Status: AC
Start: 2024-08-04 — End: 2024-08-04
  Filled 2024-08-04: qty 15

## 2024-08-04 MED ORDER — LIDOCAINE HCL (PF) 1 % IJ SOLN
0.1000 mL | Freq: Once | INTRAMUSCULAR | Status: DC | PRN
Start: 2024-08-04 — End: 2024-08-04

## 2024-08-04 MED ORDER — PROPOFOL 200 MG/20ML IV EMUL
INTRAVENOUS | Status: DC | PRN
Start: 2024-08-04 — End: 2024-08-04
  Administered 2024-08-04: 100 mg via INTRAVENOUS
  Administered 2024-08-04: 200 mg via INTRAVENOUS

## 2024-08-04 SURGICAL SUPPLY — 31 items
APPLICATOR CHLORAPREP 26ML, ~~LOC~~ (Prep Solutions) ×1 IMPLANT
BIT DRILL 1.2MM X 25MM X L81MM AO (Drills/Bits/Burs/Taps/Reamers) ×1 IMPLANT
BIT DRILL 1.2MM X 25MM X L81MM FIXATION (Drills/Bits/Burs/Taps/Reamers) ×1 IMPLANT
BLADE SAGITTAL 15 X 7MM (Knives/Blades) ×1 IMPLANT
BLADE SAGITTAL 31 X 9MM (Knives/Blades) IMPLANT
CAUTERY BOVIE ROCKER SWITCH PENCIL (Cautery) ×1 IMPLANT
DRAPE HOLOGIC FLUOROSCAN (Drapes/towels) ×1 IMPLANT
DRESSING ADAPTIC 3X3 STERILE (Dressings/packing) ×1 IMPLANT
GLOVE BIOGEL INDICATOR UNDERGLOVE SIZE 7 (Gloves/Gowns) ×1 IMPLANT
GLOVE BIOGEL INDICATOR UNDERGLOVE SIZE 8 (Gloves/Gowns) ×2 IMPLANT
GLOVE SURGEON BIOGEL SIZE 7 (Gloves/Gowns) ×1 IMPLANT
GLOVE SURGICAL BIOGEL SIZE 8 (Gloves/Gowns) ×1 IMPLANT
KWIRE 1.2 LANCET 150MM (Orthopedic guidewires) ×2 IMPLANT
PAD GROUND VALLEYLAB REM ADULT E7507 (Cautery) ×1 IMPLANT
PAD UNDERCAST WEBRIL 4X4YD (Dressings/packing) ×1 IMPLANT
PLATE SCAPHOID TRILOCK 1.2/1.5 3 X 2H (Plates/metallic mesh) ×1 IMPLANT
PROTECTOR ULNAR NERVE PAD, YELLOW (Patient Care Supply) ×1 IMPLANT
SCREW LOCKING 1.5 06MM (Screws/anchors/cables) ×1 IMPLANT
SCREW TRILOCK 1.5 11MM (Screws/anchors/cables) ×1 IMPLANT
SCREW TRILOCK 1.5 X 06.7MM (Screws/anchors/cables) ×1 IMPLANT
SCREW TRILOCK 1.5 X 12MM (Screws/anchors/cables) ×2 IMPLANT
SCREW TRILOCK 1.5 X 13MM (Screws/anchors/cables) ×1 IMPLANT
SCREW TRILOCK 1.5 X 9MM (Screws/anchors/cables) ×1 IMPLANT
SLEEVE SCD KNEE MEDIUM (Patient Care Supply) ×1 IMPLANT
SPLINT PLASTER 4X15 (External orthotic collars/braces/supports) ×12 IMPLANT
STRIP MEDI-STRIP SKIN CLOSURE 1/2 X 4" (Dressings/packing) ×1 IMPLANT
SURGICAL PACK EXTREMITY HAND (Procedure Packs/kits) ×1 IMPLANT
SUTURE ETHILON 3-0 18" PS-2 (Suture) IMPLANT
SUTURE MONOCRYL 4-0 18" PS-2 (Y496) (Suture) ×1 IMPLANT
SUTURE VICRYL PLUS 3-0 27" PS-2 VCP427H (Suture) ×1 IMPLANT
TOURNIQUET CUFF RED DISPOSABLE 18" X 4" (Misc Surgical Supply) ×1 IMPLANT

## 2024-08-04 NOTE — Discharge Instructions (Signed)
 After Hand or Arm Surgery Instructions     1. Once you are fully awake from your anesthetic you will be allowed to leave the hospital. You will need someone to drive you home.     2. Raise your operative arm above the heart to limit swelling and help to decrease pain.     3. If you had a nerve block placed to your arm or hand during surgery, the feeling and control of your operative arm may not return for up to 24 hours. You must take care not to injure your arm or hand during this time. You will be given a sling to wear until you have control of your arm. If your arm is still numb when you get ready to sleep for the night, sleep with the sling on. Sleep on your back or the non-operative side. Support your arm on a pillow.     4. You may begin icing the operative arm after the feeling returns. Put the ice pack on for 20 minutes out of each hour while you are awake. Do not let the bandage get wet. You may use the ice for up to one week.     5. Leave your splint intact until you are seen by your surgeon or by hand therapy. Elevate your hand to minimize swelling and move your exposed fingers as much as the splint will allow to keep them from getting stiff.   Do not let the splint get wet.     6. You will need to see your surgeon in the clinic in 7 to 14 days (unless otherwise told by your doctor). If you have not been given a follow-up appointment when you leave the hospital, please call the clinic where you are receiving your care and schedule an appointment.    7. Your doctor will give you medicine for pain relief. Take the pain medicine as ordered if you are having severe pain. Be sure to take any pain medicine with food in your stomach. This will help keep the medicine from upsetting your stomach. If you have been given a pain pump to use for postoperative pain control, please review the printed instructions that are in the bag containing the pain pump.     8. Wear loose fitting clothing,  especially on the neck and sleeves.     9. You may eat and drink normally once you are fully awake from the anesthesia.     10. Call your surgeon if:        Pain initially decreases, but later significantly increases      You develop persistent fever or chills     Your surgery site becomes red, hot or swollen     If you have any questions or concerns, please call your doctor or the Orthopedic Clinic where you are receiving your care. After clinic hours, call the Page Operator and ask for the Hand Surgery Team.     [facility] [phone]   [address], [place]     Outpatient Orthopedic Clinic [phone]   [facility], [facility] Page Operator [phone]   Ask for the Hand Surgery Team

## 2024-08-04 NOTE — Plan of Care (Signed)
 Problem: Promotion of Perioperative Health and Safety  Goal: Promotion of Health and Safety of the Perioperative Patient  Description: The patient remains safe, receives treatment appropriate to the surgical intervention and patient's physiological needs and is discharged or transferred to the appropriate level of care.    Information below is the current care plan.  Flowsheets (Taken 08/04/2024 1437)  Guidelines: PACU  Individualized Interventions/Recommendations #1: pain control

## 2024-08-04 NOTE — H&P (Signed)
 HISTORY & PHYSICAL - INTERVAL ASSESSMENT    **ONLY TO BE USED IN ADDITION TO A HISTORY & PHYSICAL**    Randy Erickson  [id number]      This interval H&P update references the history and physical documentation from this date:  07/13/24    Current Medical Status:  Unchanged    Medications / Allergies:  Unchanged    Review of Systems:  Unchanged    Physical Examination:  BP 138/60 (BP Location: Left arm, BP Patient Position: Sitting)   Pulse 66   Temp 97.3 F (36.3 C)   Resp 17   Ht 6' 2 (1.88 m)   Wt 90.9 kg (200 lb 6.4 oz)   SpO2 98%   BMI 25.73 kg/m   I have examined the patient today.  Unchanged    Laboratory or Clinical Data:  No results found for: CO19, COV19RAASSAY  No results found for: NA, K, CL, BICARB, BUN, CREAT, GLU, CA  No results found for: WBC, RBC, HGB, HCT, MCV, MCHC, RDW, PLT, MPV, SEG, LYMPHS, MONOS, EOS, BASOS  No results found for: AST, ALT, GGT, LDH, ALK, TP, ALB, TBILI, DBILI  No results found for: INR, PTT      Modifications of Initial Care Plan:  The care plan has been discussed with the attending physician of record and remains unchanged. This was discussed with the patient today on the morning of the procedure. The patient understands the current plan and verbally consents to the proposed treatment.     [name]     08/04/24     12:47 PM

## 2024-08-04 NOTE — Op Note (Shared)
***  volar, screw out, distal radius bone graft, [employer] plate

## 2024-08-04 NOTE — Brief Op Note (Signed)
 Orthopedic Brief Operative Note    Randy Erickson  MRN - [id number]  DOB - February 21, 1996    Date Of Surgery: 08/04/24    Pre-op Diagnosis:   Right scaphoid non-union    Post-op Diagnosis: Same    Procedure:   1. Revision open treatment scaphoid fracture with autologous bone graft from the distal radius    Surgeon(s):   Surgeons and Role:     DEWAINE Butters, Donnice Rush, MD - Primary     * [place] Arthea Mt, MD - Fellow      Findings:  See formal operative note  Tourniquet: at 250 mmhg  Grafts / Implants:   Implant Name Type Inv. Item Serial No. Manufacturer Lot No. LRB No. Used Action   1.5 X LOCKING SCREW    [employer]  Right 1 Implanted   1.5 X LOCKING SCREW    [employer]  Right 1 Implanted   1.5 X LOCKING SCREW      Right 1 Implanted   1.5 X LOCKING SCREW    [employer]  Right 2 Implanted   1.5 X LOCKING SCREW    [employer]  Right 1 Implanted   PLATE SCAPHOID TRILOCK 1.2/1.5 3 X 2H - [id number] Plates/metallic mesh PLATE SCAPHOID TRILOCK 1.2/1.5 3 X 2H  [employer]  Right 1 Implanted       Anesthesia:  Gen LMA    Fluids:  See Anesthesia Record  Estimated Blood Loss:  minimal   Blood Products Administered:  See Anesthesia Record    Complications:  None    Condition:  The patient tolerated the procedure well. He was awakened and was transferred to the recovery room in stable condition.     POST OP Plan:   - NWB R hand  - Anticipate prolonged duration of casting  - Sutures are dissolvable  - Splint until follow-up      Arthea Milford, MD  Orthopedic Surgery Hand Fellow  [facility] - Dept. Of Orthopedic Surgery  08/04/2024  2:50 PM

## 2024-08-04 NOTE — Anesthesia Postprocedure Evaluation (Signed)
 Anesthesia Post Note    Patient: Randy Erickson    Procedure(s) Performed: Procedure(s):  Right repair fracture, nonunion, scaphoid      Final anesthesia type: General    Patient location: PACU    Alternative ride arrangements? No    Post anesthesia pain: adequate analgesia    Mental status: awake, alert , and oriented    Airway Patent: Yes    Last Vitals:   Vitals Value Taken Time   BP 130/64 08/04/24 14:51   Temp 36.2 08/04/24 14:53   Pulse 59 08/04/24 14:52   Resp 14 08/04/24 14:52   SpO2 100 % 08/04/24 14:52   Vitals shown include unfiled device data.     Post vital signs: stable    Hydration: adequate    N/V:no    Anesthetic complications: no      Plan of care per primary team.

## 2024-08-06 ENCOUNTER — Telehealth (HOSPITAL_BASED_OUTPATIENT_CLINIC_OR_DEPARTMENT_OTHER): Payer: Self-pay | Admitting: Hand Surgery

## 2024-08-06 NOTE — Op Note (Signed)
 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     POSTOPERATIVE DIAGNOSIS:  Revision right scaphoid nonunion following prior  surgery.     PROCEDURE PERFORMED:    1. Open treatment of right scaphoid nonunion with autologous bone graft from the distal radius  2. Removal of hardware, right scaphoid.    SURGEON/STAFF:  Donnice Norleen Butters, MD    FELLOW SURGEON:  Arthea Deward Chang, MD    ANESTHESIA:  General    COMPLICATIONS:  None.    SPECIMENS:  None.    IMPLANTS:  [employer] volar scaphoid plate with associated screws    EXPLANTS:  Synthes headless compression screw.     COMPLEX PROCEDURE: The complexity of the procedure should be noted.  This was a complex  procedure requiring additional dissection, mobilization, and exposure  due to prior surgical dissection and loose hardware.  Additionally,  the screw was extremely challenging to remove given that there was  toggling within the scaphoid and required extra mobilization to visualize.     INDICATIONS FOR THE PROCEDURE:  Randy Erickson is a 28 year old male who  has a very complex history with his right scaphoid fracture.  He has  now undergone surgical treatment for this injury on 2 separate  occasions, most recently in 2025 in May.  Recently, he was seen in  clinic where it was noted that his scaphoid had still not healed, and  a CT scan had confirmed this after his most recent surgery.  He has  had both distal radius bone graft as well as iliac crest bone graft.  He was seen in clinic where surgical treatment in the form of second  revision open surgical treatment of his scaphoid fracture was  recommended in order to do our best to prevent early arthrosis to the  radiocarpal joint and a SNAC wrist.  We reviewed risks of this  including infection, bleeding, damage to nerves and nearby  blood  vessels.  We also reviewed the risks of surgery here including  persistent nonunion, development of arthritis, and the risk for need  for future surgeries which may include additional scaphoid fixation,  hardware removal, or up to and including treatments for degenerative  disease.  We also reviewed the risks of anesthesia including heart or  lung problems.  Despite these risks, he elected to proceed, and  informed consent was obtained and placed in the chart in the  electronic form.     PROCEDURE IN DETAIL:  On the date of the surgery, he was seen in the  preoperative holding area where the right wrist was marked as the  surgical site.  He was then brought to the operating room, placed in the   supine position with a hand table affixed to the bed.  Anesthesia was  induced.  Antibiotics were administered in the form of cefazolin .  The right upper extremity was then prepped and draped in a sterile  manner.  We designed an incision over his prior incision  volarly in a hockey stick shape in the interval between the FCR and  the radial artery.  The tourniquet was inflated after a time-out was  performed confirming the correct procedure, patient, and laterality.  We made our incision sharply with a 15 blade knife.  We dissected  very carefully through  adherent scar tissue in the interval between the  FCR and the radial artery.  The radial artery was protected as was  the FCR.  We then encountered the scaphotrapezial joint as well as  the radiocarpal joint.  We entered the joint sharply with a 15 blade.   The radioscaphocapitate ligament was divided once again in its  previous location.  At this point, we were looking at the volar  surface of the scaphoid and immediately noted the nonunion site.  This  was found to be mobile, and the screw was toggling within the  proximal and distal poles.  We then cannulated the screw with a  guidewire and attempted to remove it with the screwdriver.  This was  noted to be quite  difficult as the threads no longer had sufficient  purchase to back themselves out of the scaphoid.  After additional  dissection and extensive mobilization, with care to avoid trauma to  the blood supply to the scaphoid as well as to the scaphoid bony  architecture itself, we were able to finally manipulate the screw with a tonsil within the scaphoid, and force it backwards in a  retrograde manner and, ultimately, we were able to provide counter pressure sufficient to back the threads out just enough to be in control of  them.  Ultimately, the screw was removed after substantial time and  effort.  With the screw removed, the fracture site was irrigated.  We  debrided very carefully the bone at the scaphoid within the fracture  site.  It did appear as though there was healthy bone present.  At  this point, we chose our volar plate from the [facility] set.  It  appeared as if it would fit well although a bit proximal in order to  get enough purchase in the proximal pole.  With our plate selected,  we then measured our defect after our fracture site debridement.  It  was an approximately 1 cm defect we hoped to bridge.  As such, we  extended our dissection proximally to obtain bone graft, and elevated some of the pronator  quadratus.  We used a K-wire to make 4 separate drill holes  encompassing a 1 cm x 1 cm box of the volar cortex of the radius.  We  then harvested our bone graft from the volar cortex of the radius  using a saw connecting the pilot holes created by the K-wires.  We  obtained a corticocancellous graft which was quite robust and about 1  cm in length and 1 cm in breadth and perhaps 8 mm in depth.  We also  harvested additional cancellous graft using a curette from within the  volar radius. We then packed cancellous graft into the scaphoid  defect.  We then closed the door on this with our corticocancellous  graft which fit quite nicely and had an excellent interference.  It  was actually relatively  stable prior to putting our plate in  place.  Regardless, we proceeded to stabilization of our autograft.  We applied the plate, drilled for, and placed the distal- and  proximal-most screws. These were locking screws.  We confirmed on  fluoroscopy that they were of appropriate position.  These screws  were on the short side of being only 8 and 7 mm in length but were  very stable and we had  an adequate reduction and appearance of our scaphoid,  now packed with bone graft.  As such, we elected to keep  these screws  and proceeded to drill  for the remaining screws. Numerous  fluoroscopic views on the mini C-arm as well as live fluoroscopy  confirmed that none of the screws were too long.  The fracture was  clinically and radiographically stable.  At this point, the wrist was  thoroughly irrigated.  The volar wrist capsule and ligaments were  closed with a 3-0 Vicryl.  The skin was closed with a 3-0 Vicryl  followed by a 4-0 Monocryl.  We did also close the pronator quadratus  over the bone graft harvest site.  Marcaine  was infiltrated into the  wound.  Bacitracin , Adaptic, gauze, and a thumb spica splint were  applied.  He tolerated the procedure well.  There were no  complications.     The patient was then awakened from anesthesia and brought to the  postanesthesia care unit in stable condition.  The tourniquet was  deflated after 79 minutes at 250 mmHg.     Dr. Carlon was present for the key and critical portions of the case  and was otherwise immediately available.       POSTOPERATIVE PLAN:  He will be nonweightbearing to the right arm.  We will transition him likely to a cast at his first postoperative  visit.  We anticipate a long duration of casting.       Job #:  [id number]

## 2024-08-06 NOTE — Telephone Encounter (Signed)
 ALL information is REQUIRED to be filled out prior to routing!    Reason for call: Forms ([phone])    Patient calling to provide online disability information 08/06/2024    Provider:Meunier, Donnice MD    Verify exact spelling of name on EDD paperwork (may differ from Epic name)  so provider can find patient in system - then enter here: Randy Erickson     Is the Patient's SSN# on the Registration correct?Yes    On-line disability Receipt number     On-line DI number  : DI [phone]     Date of Injury/Surgery:  08/04/2024     Date Patient Expects to be Returning to Work: 02/01/2025  (needs to be DATE - numerical - can not be filed without and will delay processing), erase asterisk after     Job Title:  heavy Insurance account manager     Job Functions: standing , turning , twisting , lifting , leaning , pulling parts     Name of Employer:[employer] Number: n/a    Fax Number: n/a    Notes:                           (R number is 15 digits, and the DI should have 10 digits)                  Caller was informed that turnaround time for forms is 14 business days:Yes      Please inform caller the disability will begin on the date of the surgery

## 2024-08-13 ENCOUNTER — Encounter (HOSPITAL_BASED_OUTPATIENT_CLINIC_OR_DEPARTMENT_OTHER): Payer: Self-pay | Admitting: Hand Surgery

## 2024-08-13 DIAGNOSIS — M25531 Pain in right wrist: Secondary | ICD-10-CM

## 2024-08-17 ENCOUNTER — Ambulatory Visit (HOSPITAL_BASED_OUTPATIENT_CLINIC_OR_DEPARTMENT_OTHER): Admit: 2024-08-17 | Discharge: 2024-08-17 | Disposition: A | Discharge status: Home Health

## 2024-08-17 ENCOUNTER — Encounter (HOSPITAL_BASED_OUTPATIENT_CLINIC_OR_DEPARTMENT_OTHER): Payer: Self-pay | Admitting: Hospital

## 2024-08-17 ENCOUNTER — Ambulatory Visit: Attending: Hand Surgery | Admitting: Hand Surgery

## 2024-08-17 DIAGNOSIS — Z09 Encounter for follow-up examination after completed treatment for conditions other than malignant neoplasm: Secondary | ICD-10-CM

## 2024-08-17 DIAGNOSIS — S62001A Unspecified fracture of navicular [scaphoid] bone of right wrist, initial encounter for closed fracture: Secondary | ICD-10-CM

## 2024-08-17 DIAGNOSIS — S62021K Displaced fracture of middle third of navicular [scaphoid] bone of right wrist, subsequent encounter for fracture with nonunion: Secondary | ICD-10-CM

## 2024-08-17 DIAGNOSIS — M25531 Pain in right wrist: Secondary | ICD-10-CM | POA: Insufficient documentation

## 2024-08-17 NOTE — Telephone Encounter (Signed)
 EDD submitted online ... RTW: 02.01.2026 per Regan.     Patient informed via [facility].

## 2024-08-17 NOTE — Progress Notes (Signed)
 Department of Orthopaedic Surgery  Division of Hand and Microvascular Surgery    CC: F/u R scaphoid ORIF w/ bone graft s/p nonunion  DOS: 08/04/24    HISTORY OF PRESENT ILLNESS  Randy Erickson is a 28 year old male who presents for R scaphoid ORIF w/ bone graft s/p nonunion. Pt presents today without the splint that was placed during surgery; he removed the splint himself. Pt has pain around 5/10 and numbness and tingling around his volar wrist incision site.      MEDICATIONS  acetaminophen  (TYLENOL ) 500 MG tablet, Take 1 tablet (500 mg) by mouth every 6 hours as needed for Mild Pain (Pain Score 1-3).  oxyCODONE  (ROXICODONE ) 5 MG immediate release tablet, Take 1 tablet (5 mg) by mouth every 6 hours as needed for Severe Pain (Pain Score 7-10).        ALLERGIES  Allergies[1]    REVIEW OF SYSTEMS  None except that which is mentioned in the HPI    PHYSICAL EXAMINATION  VITALS: There were no vitals taken for this visit.  GENERAL: A+Ox3.  Well appearing and well groomed.   MENTAL STATUS: Pleasant and cooperative.    R Wrist / Hand:  - Wrist flexion: 5 degrees (limited by pain)  - Wrist extension: 5 degrees (limited by pain)  - Dec. Sensation around volar incision site   - Volar wrist incision is clean, dry and intact     IMAGING STUDIES    X-RAY WRIST COMPLETE MINIMUM 3 VWS - RIGHT - 08/17/24  IMPRESSION:  Status post ORIF of a nonunited scaphoid waist fracture with plate and screw fixation and bone grafting. Satisfactory alignment without radiographic complications.    IMPRESSION AND PLAN  Randy Erickson is a 28 year old male with exam and imaging consistent with a healing R scaphoid fracture s/p non-union. Time course of recovery and immobilization emphasized at length.    Plan:  - R short arm cast (emphasized not to remove it) with bone-stimulator ports  - Follow up in 6 weeks    Seen and examined with Laurey Leitz, MS4 [facility].  The history, physical exam, pertinent findings and  treatment plan were formulated by myself and discussed with the medical student and patient.             [1] No Known Allergies

## 2024-08-17 NOTE — Interdisciplinary (Signed)
 Applied R SATS w/ IP free with scaphoid bone stim port per Dr. Carlon

## 2024-09-03 ENCOUNTER — Ambulatory Visit: Attending: Hand Surgery

## 2024-09-03 DIAGNOSIS — S62021K Displaced fracture of middle third of navicular [scaphoid] bone of right wrist, subsequent encounter for fracture with nonunion: Secondary | ICD-10-CM | POA: Insufficient documentation

## 2024-09-03 NOTE — Interdisciplinary (Signed)
 Patient presented to clinic for a cast change D/C Short arm thumb spica cast.    Applied new R SATS w/ IP free with scaphoid bone stim port     Per Dr. Carlon

## 2024-09-28 ENCOUNTER — Ambulatory Visit: Attending: Hand Surgery | Admitting: Hand Surgery

## 2024-09-28 ENCOUNTER — Ambulatory Visit (HOSPITAL_BASED_OUTPATIENT_CLINIC_OR_DEPARTMENT_OTHER): Admit: 2024-09-28 | Discharge: 2024-09-28 | Disposition: A | Discharge status: Home Health

## 2024-09-28 DIAGNOSIS — S62021K Displaced fracture of middle third of navicular [scaphoid] bone of right wrist, subsequent encounter for fracture with nonunion: Secondary | ICD-10-CM

## 2024-09-28 DIAGNOSIS — Z09 Encounter for follow-up examination after completed treatment for conditions other than malignant neoplasm: Secondary | ICD-10-CM

## 2024-09-28 NOTE — Interdisciplinary (Signed)
 Removed RUE SATS     Applied new SATS with IP free with bone stim port    per Dr. Carlon

## 2024-10-18 NOTE — Progress Notes (Signed)
 Mr. Treece returns for evaluation of his right wrist following a second revision for scaphoid nonunion on August 04, 2024.  He is stable. Pain is 0/10. His cast remains mostly intact, however, he has removed most of the thumb spica component.    Examination demonstrates his incision has healed without complications. He has active FPL and EPL function.  Distal sensation is intact. Capillary refill is brisk.    Radiographs demonstrate maintenance of position of his scaphoid nonunion with no hardware complications and signs of possible consolidation.    Treatment plan: He will continue with casting and ultrasound bone stimulator. He will follow up in one month for re-evaluation with repeat radiographs. CT scan will be obtained at that time to evaluate for healing.

## 2024-10-26 ENCOUNTER — Encounter (HOSPITAL_BASED_OUTPATIENT_CLINIC_OR_DEPARTMENT_OTHER): Payer: Self-pay | Admitting: Hand Surgery

## 2024-10-26 DIAGNOSIS — M25531 Pain in right wrist: Secondary | ICD-10-CM

## 2024-11-02 ENCOUNTER — Ambulatory Visit: Attending: Hand Surgery | Admitting: Hand Surgery

## 2024-11-02 ENCOUNTER — Ambulatory Visit (HOSPITAL_BASED_OUTPATIENT_CLINIC_OR_DEPARTMENT_OTHER): Admit: 2024-11-02 | Discharge: 2024-11-02 | Disposition: A | Discharge status: Home Health

## 2024-11-02 VITALS — Ht 74.0 in | Wt 200.4 lb

## 2024-11-02 DIAGNOSIS — M89731 Major osseous defect, right forearm: Secondary | ICD-10-CM

## 2024-11-02 DIAGNOSIS — S62023K Displaced fracture of middle third of navicular [scaphoid] bone of unspecified wrist, subsequent encounter for fracture with nonunion: Secondary | ICD-10-CM

## 2024-11-02 DIAGNOSIS — Z09 Encounter for follow-up examination after completed treatment for conditions other than malignant neoplasm: Secondary | ICD-10-CM

## 2024-11-02 DIAGNOSIS — M25531 Pain in right wrist: Secondary | ICD-10-CM | POA: Insufficient documentation

## 2024-11-02 NOTE — Interdisciplinary (Signed)
 Applied new SATS with bone stim port to RUE per Dr. Carlon

## 2024-11-15 ENCOUNTER — Ambulatory Visit
Admission: RE | Admit: 2024-11-15 | Discharge: 2024-11-15 | Disposition: A | Discharge status: Home / Self Care | Attending: Hand Surgery | Admitting: Hand Surgery

## 2024-11-15 DIAGNOSIS — S62023K Displaced fracture of middle third of navicular [scaphoid] bone of unspecified wrist, subsequent encounter for fracture with nonunion: Secondary | ICD-10-CM | POA: Insufficient documentation

## 2024-11-15 DIAGNOSIS — S62021A Displaced fracture of middle third of navicular [scaphoid] bone of right wrist, initial encounter for closed fracture: Secondary | ICD-10-CM

## 2024-11-15 NOTE — Progress Notes (Signed)
 Mr. Selmer returns for evaluation of his right wrist following revision treatment of scaphoid nonunion with plate fixation. He is stable. He reports pain is a maximum of 3/10.    Examination demonstrates his surgical incision has healed without complications. He has active FPL and EPL function. Distal neurovascular exam is intact. Capillary refill is brisk.    Radiographs demonstrate no hardware complications. He appears to have early filling of callus     Treatment plan: He is stable following revision treatment of his scaphoid nonunion. He is placed into a short-arm thumb spica cast. CT scan will be obtained. He will follow up once the CT scan has been obtained.

## 2024-11-16 MED ORDER — IOHEXOL 350 MG/ML IV SOLN
INTRAVENOUS | Status: AC
  Filled 2024-11-16: qty 2500

## 2024-12-03 ENCOUNTER — Encounter (HOSPITAL_BASED_OUTPATIENT_CLINIC_OR_DEPARTMENT_OTHER): Payer: Self-pay | Admitting: Hand Surgery

## 2024-12-03 ENCOUNTER — Telehealth (HOSPITAL_BASED_OUTPATIENT_CLINIC_OR_DEPARTMENT_OTHER): Payer: Self-pay | Admitting: Hand Surgery

## 2024-12-03 DIAGNOSIS — S62023K Displaced fracture of middle third of navicular [scaphoid] bone of unspecified wrist, subsequent encounter for fracture with nonunion: Secondary | ICD-10-CM

## 2024-12-03 NOTE — Telephone Encounter (Signed)
 MCM:        DOS: 09/30/2025MERL Butters; Right repair fracture, nonunion, scaphoid   LOV: 12/29/2025MERL Butters   NOV: 02/23/2026MERL Butters     Triage Call:   CT RUE completed on 11/15/2024     RN asked patient to elaborate on his cast smell. Patient stated like shit. Patient also reports his cast is coming apart and states the cast tech, Suzen, told him sorry, I put the cast on shitty   RN asked patient if he can wiggle his fingers. Patient stated he can do all of that     Patient spent 95% of call complaining about the lack of follow up and delay in patient care. He is frustrated he has been in a cast for 3+ weeks without any follow up. He expressed frustration that the doctor did not follow up with him regarding his CT results.     Patient informed RN he lives 1+ hour away and does not want to come into the CR just for a cast change. He would be to be evaluated by Dr. Butters and do a cast change.     RN expressed empathy and apologized to the patient for the delay and the perceived lack of care. RN informed patient she will f/u with him once she knows the plan of care moving forward.     Patient verified using two qualifying patient identifiers.

## 2024-12-03 NOTE — Telephone Encounter (Signed)
 Name of caller: Renji  Relationship to patient:Self  Interpreter needed:No    Reason for call: Appointment review pt has called in to follow up on CT results. Pt states he completed CT on 11/15/2024 and has not received a call to go over results. CC agent informed pt that instructions were to call back ans schedule appt once he had completed or scheduled CT appt. Pt states he was never informed of him having to call back. CC agent secured appt for 12/28/2024 at [facility], pt stated  is that the earliest you can get me in? CC agent did inform that was the first available appt. Pt states his cast is disintegrated and needs a new cast. Pt stated he will come in to office on Monday 12/07/24 and see doctor to have his cast re-done. CC agent informed pt we do not do walk in appts with our doctors. Pt stated he was told that he can come in whenever he need to and have his cast fixed. CC agent informed pt that, that would be in CAST ROOM not at doctors office.     Pt is requesting a sooner appt with Dr. Carlon. Please assist and follow up with patient         LOV Date & Plan: 11/02/2024       Treatment plan: He is stable following revision treatment of his scaphoid nonunion. He is placed into a short-arm thumb spica cast. CT scan will be obtained. He will follow up once the CT scan has been obtained.       Best number to be reached at: [phone]             Route to RN pool if requesting clinical advice, it can take 24-48 hours turnaround time, route to MA if administrative advice it can take 3-5 business days turn around time.   If routing to [employer] >>> Providers/Facility/SNF/HH calling in with concerns related to wound, incision or rash- CCA advise them to send a picture via [facility] or secure email; [email or web address] with subject SECURE: patient name and date of birth to better assist RN upon call back      If an action is needed from the call center please route to P Ortho Scheduling Pool (not directly to cc  staff)

## 2024-12-04 NOTE — Telephone Encounter (Signed)
 RN sent patient [facility].

## 2024-12-07 NOTE — Telephone Encounter (Signed)
 RN sent patient [facility] relaying PA Trautmans response -- his current appt is appropriate for f/u and will obtain new imaging then.     CR order signed by NP McClain. RN sent patient [facility] he may call [phone] for CR appt or if his cast was exchanged to send a MyChart photo for review.

## 2024-12-10 ENCOUNTER — Encounter (HOSPITAL_BASED_OUTPATIENT_CLINIC_OR_DEPARTMENT_OTHER): Payer: Self-pay | Admitting: Hand Surgery

## 2024-12-10 DIAGNOSIS — M25531 Pain in right wrist: Secondary | ICD-10-CM

## 2024-12-11 ENCOUNTER — Ambulatory Visit: Admitting: Hand Surgery

## 2024-12-18 ENCOUNTER — Ambulatory Visit (HOSPITAL_BASED_OUTPATIENT_CLINIC_OR_DEPARTMENT_OTHER): Admitting: Hand Surgery

## 2024-12-28 ENCOUNTER — Ambulatory Visit: Admitting: Hand Surgery
